# Patient Record
Sex: Male | Born: 1970 | Hispanic: Yes | Marital: Married | Smoking: Former smoker
Health system: Southern US, Community
[De-identification: ages and names within clinical notes are randomized; demographics above are authoritative.]

## PROBLEM LIST (undated history)

## (undated) DIAGNOSIS — E785 Hyperlipidemia, unspecified: Secondary | ICD-10-CM

## (undated) DIAGNOSIS — K219 Gastro-esophageal reflux disease without esophagitis: Secondary | ICD-10-CM

## (undated) HISTORY — PX: WISDOM TOOTH EXTRACTION: SHX21

## (undated) HISTORY — DX: Gastro-esophageal reflux disease without esophagitis: K21.9

## (undated) HISTORY — DX: Hyperlipidemia, unspecified: E78.5

## (undated) HISTORY — PX: DENTAL SURGERY: SHX609

## (undated) HISTORY — PX: ROOT CANAL: SHX2363

---

## 2013-01-15 DIAGNOSIS — N411 Chronic prostatitis: Secondary | ICD-10-CM | POA: Insufficient documentation

## 2013-01-15 DIAGNOSIS — F419 Anxiety disorder, unspecified: Secondary | ICD-10-CM | POA: Insufficient documentation

## 2013-01-15 DIAGNOSIS — M549 Dorsalgia, unspecified: Secondary | ICD-10-CM | POA: Insufficient documentation

## 2013-01-15 DIAGNOSIS — IMO0001 Reserved for inherently not codable concepts without codable children: Secondary | ICD-10-CM | POA: Insufficient documentation

## 2013-01-15 DIAGNOSIS — K219 Gastro-esophageal reflux disease without esophagitis: Secondary | ICD-10-CM | POA: Insufficient documentation

## 2013-01-23 DIAGNOSIS — E785 Hyperlipidemia, unspecified: Secondary | ICD-10-CM | POA: Insufficient documentation

## 2013-02-10 DIAGNOSIS — R7401 Elevation of levels of liver transaminase levels: Secondary | ICD-10-CM | POA: Insufficient documentation

## 2014-02-18 ENCOUNTER — Emergency Department: Payer: Self-pay | Admitting: Emergency Medicine

## 2015-05-03 ENCOUNTER — Ambulatory Visit
Admission: RE | Admit: 2015-05-03 | Discharge: 2015-05-03 | Disposition: A | Discharge status: Home / Self Care | Payer: BLUE CROSS/BLUE SHIELD | Source: Ambulatory Visit | Attending: Physical Medicine and Rehabilitation | Admitting: Physical Medicine and Rehabilitation

## 2015-05-03 ENCOUNTER — Other Ambulatory Visit: Payer: Self-pay | Admitting: *Deleted

## 2015-05-03 DIAGNOSIS — M542 Cervicalgia: Secondary | ICD-10-CM | POA: Diagnosis present

## 2016-09-25 DIAGNOSIS — R0609 Other forms of dyspnea: Secondary | ICD-10-CM | POA: Insufficient documentation

## 2016-09-25 DIAGNOSIS — R5382 Chronic fatigue, unspecified: Secondary | ICD-10-CM | POA: Insufficient documentation

## 2016-09-25 DIAGNOSIS — R079 Chest pain, unspecified: Secondary | ICD-10-CM | POA: Insufficient documentation

## 2020-03-07 ENCOUNTER — Ambulatory Visit: Payer: BLUE CROSS/BLUE SHIELD

## 2020-03-13 ENCOUNTER — Ambulatory Visit: Payer: BLUE CROSS/BLUE SHIELD | Attending: Internal Medicine

## 2020-03-13 DIAGNOSIS — Z23 Encounter for immunization: Secondary | ICD-10-CM

## 2020-03-13 NOTE — Progress Notes (Signed)
   Covid-19 Vaccination Clinic  Name:  Filippo Puls    MRN: [id number] DOB: November 13, 1971  03/13/2020  Mr. Woelfel was observed post Covid-19 immunization for 15 minutes without incident. He was provided with Vaccine Information Sheet and instruction to access the V-Safe system.   Mr. Sherrow was instructed to call 911 with any severe reactions post vaccine: Marland Kitchen Difficulty breathing  . Swelling of face and throat  . A fast heartbeat  . A bad rash all over body  . Dizziness and weakness   Immunizations Administered    Name Date Dose VIS Date Route   Pfizer COVID-19 Vaccine 03/13/2020 11:34 AM 0.3 mL 11/27/2019 Intramuscular   Manufacturer: [employer], [employer]   Lot: [id number]   NDC: [address]-2

## 2020-04-03 ENCOUNTER — Ambulatory Visit: Payer: BLUE CROSS/BLUE SHIELD

## 2021-07-03 LAB — BASIC METABOLIC PANEL
BUN: 14 (ref 4–21)
CO2: 26 — AB (ref 13–22)
Chloride: 101 (ref 99–108)
Creatinine: 0.9 (ref 0.6–1.3)
Glucose: 97
Potassium: 4.7 (ref 3.4–5.3)
Sodium: 138 (ref 137–147)

## 2021-07-03 LAB — CBC AND DIFFERENTIAL
HCT: 45 (ref 41–53)
Hemoglobin: 15.4 (ref 13.5–17.5)
Neutrophils Absolute: 3.6
Platelets: 270 (ref 150–399)
WBC: 5.4

## 2021-07-03 LAB — HEPATIC FUNCTION PANEL
ALT: 19 (ref 10–40)
AST: 16 (ref 14–40)
Alkaline Phosphatase: 93 (ref 25–125)
Bilirubin, Total: 0.3

## 2021-07-03 LAB — OB RESULTS CONSOLE GC/CHLAMYDIA: Chlamydia: NEGATIVE

## 2021-07-03 LAB — COMPREHENSIVE METABOLIC PANEL
Albumin: 4.6 (ref 3.5–5.0)
Calcium: 9.7 (ref 8.7–10.7)
GFR calc non Af Amer: 101
Globulin: 2.4

## 2021-07-03 LAB — CBC: RBC: 5.01 (ref 3.87–5.11)

## 2021-07-03 LAB — TSH: TSH: 1.57 (ref 0.41–5.90)

## 2021-07-03 LAB — TESTOSTERONE: Testosterone: 579

## 2021-11-29 ENCOUNTER — Other Ambulatory Visit: Payer: Self-pay

## 2021-11-29 ENCOUNTER — Encounter: Payer: Self-pay | Admitting: Family Medicine

## 2021-11-29 ENCOUNTER — Ambulatory Visit (INDEPENDENT_AMBULATORY_CARE_PROVIDER_SITE_OTHER): Payer: Self-pay | Admitting: Family Medicine

## 2021-11-29 VITALS — BP 124/82 | HR 60 | Ht 69.0 in | Wt 165.0 lb

## 2021-11-29 DIAGNOSIS — R5382 Chronic fatigue, unspecified: Secondary | ICD-10-CM

## 2021-11-29 DIAGNOSIS — Z13220 Encounter for screening for lipoid disorders: Secondary | ICD-10-CM

## 2021-11-29 DIAGNOSIS — R6882 Decreased libido: Secondary | ICD-10-CM | POA: Insufficient documentation

## 2021-11-29 DIAGNOSIS — F419 Anxiety disorder, unspecified: Secondary | ICD-10-CM | POA: Insufficient documentation

## 2021-11-29 DIAGNOSIS — F418 Other specified anxiety disorders: Secondary | ICD-10-CM

## 2021-11-29 DIAGNOSIS — E559 Vitamin D deficiency, unspecified: Secondary | ICD-10-CM

## 2021-11-29 NOTE — Assessment & Plan Note (Signed)
New onset in the setting of chronic fatigue and depression/anxiety concern.  Risk stratification labs ordered, will follow accordingly.

## 2021-11-29 NOTE — Patient Instructions (Signed)
-   Obtain fasting labs - Review information provided - Return in 4 weeks - Contact for questions

## 2021-11-29 NOTE — Assessment & Plan Note (Signed)
See additional assessment(s) for plan details. 

## 2021-11-29 NOTE — Progress Notes (Signed)
°  ° °  Primary Care / Sports Medicine Office Visit  Patient Information:  Patient ID: Romel Dumond, male DOB: [birth year] Age: 50 y.o. MRN: [id number]   Keiland Pickering is a pleasant 50 y.o. male presenting with the following:  Chief Complaint  Patient presents with   New Patient (Initial Visit)   Establish Care   Fatigue    X6 months; difficulty concentrating, lack of motivation    Patient Active Problem List   Diagnosis Date Noted   Low libido 11/29/2021   Depression with anxiety 11/29/2021   Chronic fatigue 09/25/2016   DOE (dyspnea on exertion) 09/25/2016   Exertional chest pain 09/25/2016   Elevated alanine aminotransferase (ALT) level 02/10/2013   Hyperlipidemia 01/23/2013   Anxiety 01/15/2013   Back pain 01/15/2013   Elevated BP 01/15/2013   GERD (gastroesophageal reflux disease) 01/15/2013   Prostatitis, chronic 01/15/2013    Vitals:   11/29/21 1356  BP: 124/82  Pulse: 60  SpO2: 97%   Vitals:   11/29/21 1356  Weight: 165 lb (74.8 kg)  Height: 5\' 9"  (1.753 m)   Body mass index is 24.37 kg/m.  No results found.   Independent interpretation of notes and tests performed by another provider:   None  Procedures performed:   None  Pertinent History, Exam, Impression, and Recommendations:   Chronic fatigue Chronic issue with progressive worsening over the past 6 months, this did coincide with recent life stressors in regards to his relationship, additionally brings up history of concern over anxiety/depression, concentration issues, mental fog.  His cardiopulmonary findings are benign, plan for risk stratification labs, nonpharmacologic management of stress, and close follow-up.  If lab work-up reassuring, further depression anxiety management to be considered as well as sleep evaluation.  Low libido New onset in the setting of chronic fatigue and depression/anxiety concern.  Risk stratification labs ordered, will follow accordingly.  Depression with  anxiety See additional assessment(s) for plan details.   Orders & Medications No orders of the defined types were placed in this encounter.  Orders Placed This Encounter  Procedures   TSH Rfx on Abnormal to Free T4   CBC with Differential/Platelet   Comprehensive metabolic panel   Lipid panel   Reticulocytes   B12 and Folate Panel   VITAMIN D 25 Hydroxy (Vit-D Deficiency, Fractures)     Return in about 4 weeks (around 12/27/2021).     02/24/2022, MD   Primary Care Sports Medicine [facility]

## 2021-11-29 NOTE — Assessment & Plan Note (Signed)
Chronic issue with progressive worsening over the past 6 months, this did coincide with recent life stressors in regards to his relationship, additionally brings up history of concern over anxiety/depression, concentration issues, mental fog.  His cardiopulmonary findings are benign, plan for risk stratification labs, nonpharmacologic management of stress, and close follow-up.  If lab work-up reassuring, further depression anxiety management to be considered as well as sleep evaluation.

## 2021-12-01 LAB — COMPREHENSIVE METABOLIC PANEL
ALT: 35 IU/L (ref 0–44)
AST: 20 IU/L (ref 0–40)
Albumin/Globulin Ratio: 2.3 — ABNORMAL HIGH (ref 1.2–2.2)
Albumin: 5.2 g/dL — ABNORMAL HIGH (ref 4.0–5.0)
Alkaline Phosphatase: 97 IU/L (ref 44–121)
BUN/Creatinine Ratio: 15 (ref 9–20)
BUN: 14 mg/dL (ref 6–24)
Bilirubin Total: 0.4 mg/dL (ref 0.0–1.2)
CO2: 29 mmol/L (ref 20–29)
Calcium: 9.9 mg/dL (ref 8.7–10.2)
Chloride: 97 mmol/L (ref 96–106)
Creatinine, Ser: 0.96 mg/dL (ref 0.76–1.27)
Globulin, Total: 2.3 g/dL (ref 1.5–4.5)
Glucose: 87 mg/dL (ref 70–99)
Potassium: 4.6 mmol/L (ref 3.5–5.2)
Sodium: 139 mmol/L (ref 134–144)
Total Protein: 7.5 g/dL (ref 6.0–8.5)
eGFR: 96 mL/min/{1.73_m2} (ref >59)

## 2021-12-01 LAB — LIPID PANEL
Chol/HDL Ratio: 5.7 ratio — ABNORMAL HIGH (ref 0.0–5.0)
Cholesterol, Total: 261 mg/dL — ABNORMAL HIGH (ref 100–199)
HDL: 46 mg/dL (ref >39)
LDL Chol Calc (NIH): 189 mg/dL — ABNORMAL HIGH (ref 0–99)
Triglycerides: 144 mg/dL (ref 0–149)
VLDL Cholesterol Cal: 26 mg/dL (ref 5–40)

## 2021-12-01 LAB — CBC WITH DIFFERENTIAL/PLATELET
Basophils Absolute: 0 10*3/uL (ref 0.0–0.2)
Basos: 1 %
EOS (ABSOLUTE): 0.1 10*3/uL (ref 0.0–0.4)
Eos: 1 %
Hematocrit: 48.8 % (ref 37.5–51.0)
Hemoglobin: 16.6 g/dL (ref 13.0–17.7)
Immature Grans (Abs): 0 10*3/uL (ref 0.0–0.1)
Immature Granulocytes: 1 %
Lymphocytes Absolute: 1.7 10*3/uL (ref 0.7–3.1)
Lymphs: 27 %
MCH: 30.5 pg (ref 26.6–33.0)
MCHC: 34 g/dL (ref 31.5–35.7)
MCV: 90 fL (ref 79–97)
Monocytes Absolute: 0.4 10*3/uL (ref 0.1–0.9)
Monocytes: 6 %
Neutrophils Absolute: 4.1 10*3/uL (ref 1.4–7.0)
Neutrophils: 64 %
Platelets: 247 10*3/uL (ref 150–450)
RBC: 5.44 x10E6/uL (ref 4.14–5.80)
RDW: 12.7 % (ref 11.6–15.4)
WBC: 6.3 10*3/uL (ref 3.4–10.8)

## 2021-12-01 LAB — VITAMIN D 25 HYDROXY (VIT D DEFICIENCY, FRACTURES): Vit D, 25-Hydroxy: 23 ng/mL — ABNORMAL LOW (ref 30.0–100.0)

## 2021-12-01 LAB — RETICULOCYTES: Retic Ct Pct: 1.7 % (ref 0.6–2.6)

## 2021-12-01 LAB — B12 AND FOLATE PANEL
Folate: 10.3 ng/mL (ref >3.0)
Vitamin B-12: 1860 pg/mL — ABNORMAL HIGH (ref 232–1245)

## 2021-12-01 LAB — TSH RFX ON ABNORMAL TO FREE T4: TSH: 1.88 u[IU]/mL (ref 0.450–4.500)

## 2021-12-04 ENCOUNTER — Other Ambulatory Visit: Payer: Self-pay | Admitting: Family Medicine

## 2021-12-04 DIAGNOSIS — E559 Vitamin D deficiency, unspecified: Secondary | ICD-10-CM

## 2021-12-04 DIAGNOSIS — R748 Abnormal levels of other serum enzymes: Secondary | ICD-10-CM

## 2021-12-04 DIAGNOSIS — E782 Mixed hyperlipidemia: Secondary | ICD-10-CM

## 2021-12-04 MED ORDER — VITAMIN D (ERGOCALCIFEROL) 1.25 MG (50000 UNIT) PO CAPS: 50000.0000 [IU] | ORAL_CAPSULE | ORAL | 0 refills | Status: DC

## 2021-12-04 MED ORDER — ROSUVASTATIN CALCIUM 5 MG PO TABS: 5.0000 mg | ORAL_TABLET | Freq: Every day | ORAL | 3 refills | Status: DC

## 2021-12-12 ENCOUNTER — Encounter: Payer: Self-pay | Admitting: Family Medicine

## 2021-12-12 ENCOUNTER — Ambulatory Visit
Admission: RE | Admit: 2021-12-12 | Discharge: 2021-12-12 | Disposition: A | Discharge status: Home / Self Care | Payer: Self-pay | Source: Ambulatory Visit | Attending: Family Medicine | Admitting: Family Medicine

## 2021-12-12 ENCOUNTER — Other Ambulatory Visit: Payer: Self-pay

## 2021-12-12 DIAGNOSIS — E782 Mixed hyperlipidemia: Secondary | ICD-10-CM | POA: Insufficient documentation

## 2021-12-12 DIAGNOSIS — R748 Abnormal levels of other serum enzymes: Secondary | ICD-10-CM | POA: Insufficient documentation

## 2021-12-12 NOTE — Progress Notes (Signed)
Please let Mr. Duval know that the ultrasound liver findings were most consistent with fatty liver, this fatty change can account for the lab results noted. For now continue healthy lifestyle changes and I will see him 12/27/2021

## 2021-12-27 ENCOUNTER — Other Ambulatory Visit: Payer: Self-pay

## 2021-12-27 ENCOUNTER — Ambulatory Visit (INDEPENDENT_AMBULATORY_CARE_PROVIDER_SITE_OTHER): Payer: Self-pay | Admitting: Family Medicine

## 2021-12-27 ENCOUNTER — Encounter: Payer: Self-pay | Admitting: Family Medicine

## 2021-12-27 VITALS — BP 142/90 | HR 86 | Ht 69.0 in | Wt 166.0 lb

## 2021-12-27 DIAGNOSIS — R5382 Chronic fatigue, unspecified: Secondary | ICD-10-CM

## 2021-12-27 DIAGNOSIS — R143 Flatulence: Secondary | ICD-10-CM | POA: Insufficient documentation

## 2021-12-27 DIAGNOSIS — F418 Other specified anxiety disorders: Secondary | ICD-10-CM

## 2021-12-27 HISTORY — DX: Flatulence: R14.3

## 2021-12-27 MED ORDER — DESVENLAFAXINE SUCCINATE ER 25 MG PO TB24: 25.0000 mg | ORAL_TABLET | Freq: Every day | ORAL | 1 refills | Status: DC

## 2021-12-27 NOTE — Assessment & Plan Note (Signed)
Newly brought up issue that he has been experiencing for several weeks, notes this more with food intake.  No change in odor, no steatorrhea or change in his bowels.  He has noted bloating and increased flatulence.  I reviewed various possible causes of this and treatments.  He is amenable to a trial of probiotics that he can pick up OTC.  We will follow-up on this on as-needed basis.

## 2021-12-27 NOTE — Progress Notes (Signed)
Primary Care / Sports Medicine Office Visit  Patient Information:  Patient ID: Adrian Lee, male DOB: 09/16/1971 Age: 51 y.o. MRN: [id number]   Adrian Lee is a pleasant 51 y.o. male presenting with the following:  Chief Complaint  Patient presents with   Chronic Fatigue     Pt feels tired on and off, has gotten better, pt still wakes up at 3:30 every morning, cant go back to sleep due to mind racing, Only sleeps 5 hours at night    Depression w/ Anxiety     Patient Active Problem List   Diagnosis Date Noted   Flatulence 12/27/2021   Low libido 11/29/2021   Depression with anxiety 11/29/2021   Chronic fatigue 09/25/2016   DOE (dyspnea on exertion) 09/25/2016   Exertional chest pain 09/25/2016   Elevated alanine aminotransferase (ALT) level 02/10/2013   Hyperlipidemia 01/23/2013   Anxiety 01/15/2013   Back pain 01/15/2013   Elevated BP 01/15/2013   GERD (gastroesophageal reflux disease) 01/15/2013   Prostatitis, chronic 01/15/2013    Vitals:   12/27/21 1005  BP: (!) 142/90  Pulse: 86  SpO2: 97%   Vitals:   12/27/21 1005  Weight: 166 lb (75.3 kg)  Height: 5\' 9"  (1.753 m)   Body mass index is 24.51 kg/m.  ELASTOGRAPHY LIVER  Result Date: 12/12/2021 CLINICAL DATA:  Abnormal liver function tests EXAM: 12/14/2021 LIVER ELASTOGRAPHY TECHNIQUE: Sonography of the liver was performed. In addition, ultrasound elastography evaluation of the liver was performed. A region of interest was placed within the right lobe of the liver. Following application of a compressive sonographic pulse, tissue compressibility was assessed. Multiple assessments were performed at the selected site. Median tissue compressibility was determined. Previously, hepatic stiffness was assessed by shear wave velocity. Based on recently published Society of Radiologists in Ultrasound consensus article, reporting is now recommended to be performed in the SI units of pressure (kiloPascals) representing hepatic  stiffness/elasticity. The obtained result is compared to the published reference standards. (cACLD = compensated Advanced Chronic Liver Disease) COMPARISON:  None. FINDINGS: Liver: There is increased echogenicity suggesting fatty infiltration. No focal abnormality is seen. There is no definite nodularity in the liver surface. Portal vein is patent on color Doppler imaging with normal direction of blood flow towards the liver. ULTRASOUND HEPATIC ELASTOGRAPHY Device: Siemens Helix VTQ Patient position: Supine Transducer: 5 CL Number of measurements: 10 Hepatic segment:  8 Median kPa: 3.8 IQR: 0.6 IQR/Median kPa ratio: 0.2 Data quality: Diagnostic category: The use of hepatic elastography is applicable to patients with viral hepatitis and non-alcoholic fatty liver disease. At this time, there is insufficient data for the referenced cut-off values and use in other causes of liver disease, including alcoholic liver disease. Patients, however, may be assessed by elastography and serve as their own reference standard/baseline. In patients with non-alcoholic liver disease, the values suggesting compensated advanced chronic liver disease (cACLD) may be lower, and patients may need additional testing with elasticity results of 7-9 kPa. Please note that abnormal hepatic elasticity and shear wave velocities may also be identified in clinical settings other than with hepatic fibrosis, such as: acute hepatitis, elevated right heart and central venous pressures including use of beta blockers, veno-occlusive disease (Budd-Chiari), infiltrative processes such as mastocytosis/amyloidosis/infiltrative tumor/lymphoma, extrahepatic cholestasis, with hyperemia in the post-prandial state, and with liver transplantation. Correlation with patient history, laboratory data, and clinical condition recommended. Diagnostic Categories: < or =5 kPa: high probability of being normal < or =9 kPa: in the absence of  other known clinical signs, rules  out cACLD >9 kPa and ?13 kPa: suggestive of cACLD, but needs further testing >13 kPa: highly suggestive of cACLD > or =17 kPa: highly suggestive of cACLD with an increased probability of clinically significant portal hypertension IMPRESSION: ULTRASOUND LIVER: There is fatty infiltration in the liver. ULTRASOUND HEPATIC ELASTOGRAPHY: Median kPa:  3.8 Diagnostic category:  High probability of being normal Electronically Signed   By: Ernie Avena M.D.   On: 12/12/2021 09:27     Independent interpretation of notes and tests performed by another provider:   None  Procedures performed:   None  Pertinent History, Exam, Impression, and Recommendations:   Depression with anxiety Patient is demonstrated slightly worsened GAD, slightly improved PHQ, still symptomatic.  Given his concern over fatigue, low libido, low energy, and sleep disturbance, recent reassuring labs that do not directly account for any of those issues, we have discussed depression/anxiety as primary etiology behind his symptoms.  Treatment strategies were outlined, questions were answered at length, and is amenable to start of Pristiq 25 mg daily.  He is to return for follow-up in 6 weeks time for reevaluation.  Pending symptoms at that time, can discuss further titration or referral to psychiatry for further evaluation and management strategies such as cognitive behavioral therapy.  Chronic issue with exacerbation, medication management.  Chronic fatigue Recent serum studies reassuring and do not show any findings to directly account for his chronic fatigue.  Concern for depression/anxiety as primary etiology. See additional assessment(s) for plan details.  We will recheck his symptoms at his return, if still symptomatic, pending residual symptoms may benefit from sleep study/ENT sleep medicine referral.  Chronic issue, exacerbation.  Flatulence Newly brought up issue that he has been experiencing for several weeks, notes this  more with food intake.  No change in odor, no steatorrhea or change in his bowels.  He has noted bloating and increased flatulence.  I reviewed various possible causes of this and treatments.  He is amenable to a trial of probiotics that he can pick up OTC.  We will follow-up on this on as-needed basis.   Orders & Medications Meds ordered this encounter  Medications   desvenlafaxine (PRISTIQ) 25 MG 24 hr tablet    Sig: Take 1 tablet (25 mg total) by mouth daily.    Dispense:  30 tablet    Refill:  1   No orders of the defined types were placed in this encounter.    Return in about 6 weeks (around 02/07/2022).     Jerrol Banana, MD   Primary Care Sports Medicine [facility]

## 2021-12-27 NOTE — Assessment & Plan Note (Addendum)
Recent serum studies reassuring and do not show any findings to directly account for his chronic fatigue.  Concern for depression/anxiety as primary etiology. See additional assessment(s) for plan details.  We will recheck his symptoms at his return, if still symptomatic, pending residual symptoms may benefit from sleep study/ENT sleep medicine referral.  Chronic issue, exacerbation.

## 2021-12-27 NOTE — Assessment & Plan Note (Signed)
Patient is demonstrated slightly worsened GAD, slightly improved PHQ, still symptomatic.  Given his concern over fatigue, low libido, low energy, and sleep disturbance, recent reassuring labs that do not directly account for any of those issues, we have discussed depression/anxiety as primary etiology behind his symptoms.  Treatment strategies were outlined, questions were answered at length, and is amenable to start of Pristiq 25 mg daily.  He is to return for follow-up in 6 weeks time for reevaluation.  Pending symptoms at that time, can discuss further titration or referral to psychiatry for further evaluation and management strategies such as cognitive behavioral therapy.  Chronic issue with exacerbation, medication management.

## 2021-12-27 NOTE — Patient Instructions (Addendum)
-   Start Pristiq daily - Can start over the counter probiotic - Return in 6 weeks

## 2022-02-07 ENCOUNTER — Other Ambulatory Visit: Payer: Self-pay

## 2022-02-07 ENCOUNTER — Ambulatory Visit (INDEPENDENT_AMBULATORY_CARE_PROVIDER_SITE_OTHER): Payer: Self-pay | Admitting: Family Medicine

## 2022-02-07 ENCOUNTER — Encounter: Payer: Self-pay | Admitting: Family Medicine

## 2022-02-07 ENCOUNTER — Telehealth: Payer: Self-pay

## 2022-02-07 VITALS — BP 124/72 | HR 86 | Ht 69.0 in | Wt 165.0 lb

## 2022-02-07 DIAGNOSIS — R6882 Decreased libido: Secondary | ICD-10-CM

## 2022-02-07 DIAGNOSIS — Z125 Encounter for screening for malignant neoplasm of prostate: Secondary | ICD-10-CM | POA: Insufficient documentation

## 2022-02-07 DIAGNOSIS — R5382 Chronic fatigue, unspecified: Secondary | ICD-10-CM

## 2022-02-07 DIAGNOSIS — F418 Other specified anxiety disorders: Secondary | ICD-10-CM

## 2022-02-07 DIAGNOSIS — N411 Chronic prostatitis: Secondary | ICD-10-CM

## 2022-02-07 DIAGNOSIS — Z1211 Encounter for screening for malignant neoplasm of colon: Secondary | ICD-10-CM | POA: Insufficient documentation

## 2022-02-07 MED ORDER — DESVENLAFAXINE SUCCINATE ER 50 MG PO TB24: 50.0000 mg | ORAL_TABLET | Freq: Every day | ORAL | 2 refills | Status: DC

## 2022-02-07 NOTE — Assessment & Plan Note (Signed)
Patient presents for follow-up to depression/anxiety, at his last visit on 12/27/2021 he had progressively worsened GAD and slightly improved PHQ that was still symptomatic.  Given his spectrum of symptoms, he was amenable to initiation of Pristiq 25 mg daily.  He has tolerated this well and has noted significant improvement in his stated symptoms though without full resolution.  He denies any adverse effects during the initiation of this medication or since.  Given his stated symptomatology, level of improvement, we discussed medication management options and he was amenable to further titration to Pristiq 50 mg daily.  We will coordinate a virtual visit in 3 months to assess his response to new dose.  He was advised to contact us sooner if further adjustments needed such as a decrease to previous dose.  Chronic condition, stable, Rx management

## 2022-02-07 NOTE — Progress Notes (Signed)
°  ° °  Primary Care / Sports Medicine Office Visit  Patient Information:  Patient ID: Adrian Lee, male DOB: [birth year] Age: 51 y.o. MRN: [id number]   Adrian Lee is a pleasant 51 y.o. male presenting with the following:  Chief Complaint  Patient presents with   Depression with anxiety    Tolerating Pristiq 25 mg well    Vitals:   02/07/22 0956  BP: 124/72  Pulse: 86  SpO2: 97%   Vitals:   02/07/22 0956  Weight: 165 lb (74.8 kg)  Height: 5\' 9"  (1.753 m)   Body mass index is 24.37 kg/m.  No results found.   Independent interpretation of notes and tests performed by another provider:   None  Procedures performed:   None  Pertinent History, Exam, Impression, and Recommendations:   Prostatitis, chronic Patient with a history of chronic prostatitis, raises desire for prostate cancer screening, guidelines reviewed, and given his history of chronic prostatitis, desire for further evaluation, I have advised a referral to urology for further evaluation and management.  Prostate cancer screening See additional assessment(s) for plan details.  Low libido Has noted persistent low libido despite improvement in his depression/anxiety, we discussed pharmacotherapy with sildenafil, etc., he raises concern over testosterone.  Given his comorbid prostate history, desire for further specialist evaluation, I have placed a referral to urology.  Chronic fatigue Patient presents for follow-up to chronic fatigue, at his last visit on 12/27/2021 he was noted to have reassuring lab studies, treatment initiated for comorbid depression/anxiety to assess the role of this as underlying etiology behind his chronic fatigue.  He has noted improvement and near resolution of his chronic fatigue following initial dose of Pristiq.  Further titration of Pristiq reviewed and we will follow-up on this issue in 3 months time.  Chronic condition, stable, Rx management  Depression with anxiety Patient  presents for follow-up to depression/anxiety, at his last visit on 12/27/2021 he had progressively worsened GAD and slightly improved PHQ that was still symptomatic.  Given his spectrum of symptoms, he was amenable to initiation of Pristiq 25 mg daily.  He has tolerated this well and has noted significant improvement in his stated symptoms though without full resolution.  He denies any adverse effects during the initiation of this medication or since.  Given his stated symptomatology, level of improvement, we discussed medication management options and he was amenable to further titration to Pristiq 50 mg daily.  We will coordinate a virtual visit in 3 months to assess his response to new dose.  He was advised to contact 02/24/2022 sooner if further adjustments needed such as a decrease to previous dose.  Chronic condition, stable, Rx management  Screening for colon cancer Referral placed for colon cancer screening/colonoscopy.   Orders & Medications Meds ordered this encounter  Medications   desvenlafaxine (PRISTIQ) 50 MG 24 hr tablet    Sig: Take 1 tablet (50 mg total) by mouth daily.    Dispense:  30 tablet    Refill:  2   Orders Placed This Encounter  Procedures   Ambulatory referral to Gastroenterology   Ambulatory referral to Urology     Return in about 3 months (around 05/07/2022) for Virtual visit.     05/09/2022, MD   Primary Care Sports Medicine [facility]

## 2022-02-07 NOTE — Patient Instructions (Signed)
-   Take new dose of Pristiq 50 mg daily - Referral coordinator will contact you in regards to scheduling a follow-up visit with both urology and gastroenterology - Contact us for any questions - Virtual follow-up in 3 months

## 2022-02-07 NOTE — Assessment & Plan Note (Signed)
Has noted persistent low libido despite improvement in his depression/anxiety, we discussed pharmacotherapy with sildenafil, etc., he raises concern over testosterone.  Given his comorbid prostate history, desire for further specialist evaluation, I have placed a referral to urology.

## 2022-02-07 NOTE — Assessment & Plan Note (Signed)
Patient presents for follow-up to chronic fatigue, at his last visit on 12/27/2021 he was noted to have reassuring lab studies, treatment initiated for comorbid depression/anxiety to assess the role of this as underlying etiology behind his chronic fatigue.  He has noted improvement and near resolution of his chronic fatigue following initial dose of Pristiq.  Further titration of Pristiq reviewed and we will follow-up on this issue in 3 months time.  Chronic condition, stable, Rx management

## 2022-02-07 NOTE — Assessment & Plan Note (Signed)
Patient with a history of chronic prostatitis, raises desire for prostate cancer screening, guidelines reviewed, and given his history of chronic prostatitis, desire for further evaluation, I have advised a referral to urology for further evaluation and management.

## 2022-02-07 NOTE — Assessment & Plan Note (Signed)
See additional assessment(s) for plan details. 

## 2022-02-07 NOTE — Telephone Encounter (Signed)
CALLED PATIENT NO ANSWER LEFT VOICEMAIL FOR A CALL BACK ? ?

## 2022-02-07 NOTE — Assessment & Plan Note (Signed)
Referral placed for colon cancer screening/colonoscopy.

## 2022-02-08 ENCOUNTER — Telehealth: Payer: Self-pay

## 2022-02-08 NOTE — Telephone Encounter (Signed)
CALLED PATIENT NO ANSWER LEFT VOICEMAIL FOR A CALL BACK °Letter sent °

## 2022-02-14 NOTE — Progress Notes (Signed)
? ?02/16/22 ?9:11 AM  ? ?Adrian Lee ?1971-05-02 ?[id number] ? ?Referring provider:  ?Jerrol Banana, MD ?[address] ?[address] ?[place],  [place] [address] ?Chief Complaint  ?Patient presents with  ? New Patient (Initial Visit)  ? Prostatitis  ? ? ? ?HPI: ?Adrian Lee is a 51 y.o.male who presents today for further evaluation of chronic prostatitis.  ? ?He has a history of chronic prostatitis and decreased libido. He was seen by his PCP, Dr. Lauris Serviss Royalty on 02/07/2022 and desired prostate cancer screening guidelines were discussed and he was further referred to urology for discussion of prostate cancer screening and prostatitis.  ? ?He reports that he is interested in prostate cancer screening. ? ?He is concerned for low testosterone he has a low libido and tiredness. He has been taking over the counter pills named Rhinos.  Of note, he had a normal testosterone level in 06/2021.  He reports that he is seeing other men at the gym who started taking testosterone and have improved erections, are able to build more muscle, just generally feel improved. ? ?He last had prostatitis 2 years ago after his vasectomy and the problems went away. He has burning and with urination but this is fairly rare.  Previously when he had episodes of prostatitis, he reports pelvic and perineal discomfort.  He is not experiencing this currently. ? ?He reports that he took a friends viagra but he was not able to have an erection until the morning that was prolonged.  He feels like the medication did not help him specifically that time.  He feels a decreased ability to achieve and maintain erections when desired. ? ?He has no family history of prostate cancer. ? ?PMH: ?Past Medical History:  ?Diagnosis Date  ? Flatulence 12/27/2021  ? GERD (gastroesophageal reflux disease)   ? Hyperlipidemia   ? ? ?Surgical History: ?Past Surgical History:  ?Procedure Laterality Date  ? ROOT CANAL    ? WISDOM TOOTH EXTRACTION Bilateral   ? ? ?Home Medications:   ?Allergies as of 02/15/2022   ?No Known Allergies ?  ? ?  ?Medication List  ?  ? ?  ? Accurate as of February 15, 2022 11:59 PM. If you have any questions, ask your nurse or doctor.  ?  ?  ? ?  ? ?desvenlafaxine 50 MG 24 hr tablet ?Commonly known as: PRISTIQ ?Take 1 tablet (50 mg total) by mouth daily. ?  ?MULTIVITAMIN ADULT PO ?Take 1 tablet by mouth daily. ?  ?OMEPRAZOLE PO ?Take 1 capsule by mouth as needed. ?  ?rosuvastatin 5 MG tablet ?Commonly known as: Crestor ?Take 1 tablet (5 mg total) by mouth daily. ?  ?sildenafil 20 MG tablet ?Commonly known as: REVATIO ?1-5 tablets as needed one hour prior to intercourse. ?Started by: Vanna Scotland, MD ?  ? ?  ? ? ?Allergies: No Known Allergies ? ?Family History: ?Family History  ?Problem Relation Age of Onset  ? Diabetes Mother   ? ? ?Social History:  reports that he has quit smoking. His smoking use included cigarettes. He has never been exposed to tobacco smoke. He has never used smokeless tobacco. He reports current alcohol use. He reports that he does not currently use drugs after having used the following drugs: Marijuana, Cocaine, and Oxycodone. ? ? ?Physical Exam: ?BP 126/85   Pulse 77   Ht 5\' 9"  (1.753 m)   Wt 165 lb (74.8 kg)   BMI 24.37 kg/m?   ?Constitutional:  Alert and oriented, No acute  distress. ?HEENT: [place] AT, moist mucus membranes.  Trachea midline, no masses. ?Cardiovascular: No clubbing, cyanosis, or edema. ?Respiratory: Normal respiratory effort, no increased work of breathing. ?Rectal: Normal sphincter tone,  20  CC prostate, smooth no nodules ?Skin: No rashes, bruises or suspicious lesions. ?Neurologic: Grossly intact, no focal deficits, moving all 4 extremities. ?Psychiatric: Normal mood and affect. ? ?Laboratory Data: ?Lab Results  ?Component Value Date  ? CREATININE 0.96 11/30/2021  ? ?Lab Results  ?Component Value Date  ? TESTOSTERONE 579 07/03/2021  ? ?Assessment & Plan:   ?Prostate cancer screening  ?- Rectal exam today  ?- PSA; pending; will  call him with the results  ?- If his PSA is within normal range his next screening will be at age 77. ? ?2. Chronic Prostatitis  ?- Urinalysis unremarkable today  ?- He has not had any symptoms ? ?3. Low libido/fatigue ?- He is concerned for low testosterone today and would like his testosterone levels checked  ?- Testosterone level; pending  ?-We did have a discussion that if his testosterone levels are normal, we can treat his erectile dysfunction and look for other reasons for his libido and fatigue.  We will not be able to administer testosterone with normal testosterone levels. ? ?4. ED ?- He is interested in medication today  ?-We discussed optimal administration today including at least 1 to 2 hours before sexual activity ideally on empty stomach. ?- Viagra; prescribed  ? ? ? ?Tawni Millers as a scribe for Vanna Scotland, MD.,have documented all relevant documentation on the behalf of Vanna Scotland, MD,as directed by  Vanna Scotland, MD while in the presence of Vanna Scotland, MD. ? ?I have reviewed the above documentation for accuracy and completeness, and I agree with the above.  ? ?Vanna Scotland, MD ? ? ?[facility] ?[address], [address] ?[place], [place] [address] ?[phone] ?

## 2022-02-15 ENCOUNTER — Encounter: Payer: Self-pay | Admitting: Urology

## 2022-02-15 ENCOUNTER — Ambulatory Visit (INDEPENDENT_AMBULATORY_CARE_PROVIDER_SITE_OTHER): Payer: PRIVATE HEALTH INSURANCE | Admitting: Urology

## 2022-02-15 ENCOUNTER — Other Ambulatory Visit: Payer: Self-pay

## 2022-02-15 VITALS — BP 126/85 | HR 77 | Ht 69.0 in | Wt 165.0 lb

## 2022-02-15 DIAGNOSIS — N5203 Combined arterial insufficiency and corporo-venous occlusive erectile dysfunction: Secondary | ICD-10-CM

## 2022-02-15 DIAGNOSIS — Z125 Encounter for screening for malignant neoplasm of prostate: Secondary | ICD-10-CM | POA: Diagnosis not present

## 2022-02-15 DIAGNOSIS — R6882 Decreased libido: Secondary | ICD-10-CM

## 2022-02-15 DIAGNOSIS — Z87438 Personal history of other diseases of male genital organs: Secondary | ICD-10-CM

## 2022-02-15 DIAGNOSIS — N410 Acute prostatitis: Secondary | ICD-10-CM | POA: Diagnosis not present

## 2022-02-15 LAB — MICROSCOPIC EXAMINATION
Bacteria, UA: NONE SEEN
Epithelial Cells (non renal): NONE SEEN /hpf (ref 0–10)
RBC, Urine: NONE SEEN /hpf (ref 0–2)
WBC, UA: NONE SEEN /hpf (ref 0–5)

## 2022-02-15 LAB — URINALYSIS, COMPLETE
Bilirubin, UA: NEGATIVE
Glucose, UA: NEGATIVE
Ketones, UA: NEGATIVE
Leukocytes,UA: NEGATIVE
Nitrite, UA: NEGATIVE
Protein,UA: NEGATIVE
RBC, UA: NEGATIVE
Specific Gravity, UA: 1.02 (ref 1.005–1.030)
Urobilinogen, Ur: 0.2 mg/dL (ref 0.2–1.0)
pH, UA: 6.5 (ref 5.0–7.5)

## 2022-02-15 MED ORDER — SILDENAFIL CITRATE 20 MG PO TABS: ORAL_TABLET | ORAL | 6 refills | Status: DC

## 2022-02-16 ENCOUNTER — Telehealth: Payer: Self-pay | Admitting: *Deleted

## 2022-02-16 LAB — PSA: Prostate Specific Ag, Serum: 0.3 ng/mL (ref 0.0–4.0)

## 2022-02-16 LAB — TESTOSTERONE: Testosterone: 461 ng/dL (ref 264–916)

## 2022-02-16 NOTE — Telephone Encounter (Addendum)
Patient informed, voiced understanding.  Aware to have PSA screening at 84.  ? ? ? ? ?----- Message from Vanna Scotland, MD sent at 02/16/2022  9:52 AM EST ----- ?Testosterone is completely normal.  PSA is nice and low.  This is all great news.  Follow-up as needed. ? ?Vanna Scotland, MD ? ?

## 2022-03-28 ENCOUNTER — Other Ambulatory Visit: Payer: Self-pay | Admitting: Family Medicine

## 2022-03-28 DIAGNOSIS — F418 Other specified anxiety disorders: Secondary | ICD-10-CM

## 2022-03-28 NOTE — Telephone Encounter (Signed)
Requested Prescriptions  ?Pending Prescriptions Disp Refills  ?? desvenlafaxine (PRISTIQ) 25 MG 24 hr tablet [Pharmacy Med Name: DESVENLAFAXINE SUCCNT ER 25 MG] 30 tablet 1  ?  Sig: TAKE 1 TABLET (25 MG TOTAL) BY MOUTH DAILY.  ?  ? Psychiatry: Antidepressants - SNRI - desvenlafaxine & venlafaxine Failed - 03/28/2022  2:09 AM  ?  ?  Failed - Lipid Panel in normal range within the last 12 months  ?  Cholesterol, Total  ?Date Value Ref Range Status  ?11/30/2021 261 (H) 100 - 199 mg/dL Final  ? ?LDL Chol Calc (NIH)  ?Date Value Ref Range Status  ?11/30/2021 189 (H) 0 - 99 mg/dL Final  ? ?HDL  ?Date Value Ref Range Status  ?11/30/2021 46 >39 mg/dL Final  ? ?Triglycerides  ?Date Value Ref Range Status  ?11/30/2021 144 0 - 149 mg/dL Final  ? ?  ?  ?  Passed - Cr in normal range and within 360 days  ?  Creatinine, Ser  ?Date Value Ref Range Status  ?11/30/2021 0.96 0.76 - 1.27 mg/dL Final  ?   ?  ?  Passed - Completed PHQ-2 or PHQ-9 in the last 360 days  ?  ?  Passed - Last BP in normal range  ?  BP Readings from Last 1 Encounters:  ?02/15/22 126/85  ?   ?  ?  Passed - Valid encounter within last 6 months  ?  Recent Outpatient Visits   ?      ? 1 month ago Prostate cancer screening  ? [facility] Jerrol Banana, MD  ? 3 months ago Depression with anxiety  ? [facility] Jerrol Banana, MD  ? 3 months ago Chronic fatigue  ? [facility] Jerrol Banana, MD  ?  ?  ?Future Appointments   ?        ? In 1 month Ashley Royalty Ocie Bob, MD [facility]  ?  ? ?  ?  ?  ? ? ?

## 2022-05-07 ENCOUNTER — Ambulatory Visit (INDEPENDENT_AMBULATORY_CARE_PROVIDER_SITE_OTHER): Payer: Self-pay | Admitting: Family Medicine

## 2022-05-07 ENCOUNTER — Encounter: Payer: Self-pay | Admitting: Family Medicine

## 2022-05-07 DIAGNOSIS — E782 Mixed hyperlipidemia: Secondary | ICD-10-CM | POA: Diagnosis not present

## 2022-05-07 DIAGNOSIS — F418 Other specified anxiety disorders: Secondary | ICD-10-CM

## 2022-05-07 DIAGNOSIS — R6882 Decreased libido: Secondary | ICD-10-CM

## 2022-05-07 MED ORDER — DESVENLAFAXINE SUCCINATE ER 50 MG PO TB24: 50.0000 mg | ORAL_TABLET | Freq: Every day | ORAL | 3 refills | Status: DC

## 2022-05-07 NOTE — Assessment & Plan Note (Signed)
Patient had recheck of labs through outside testosterone replacement group and were find to be in normal range (reported) while on statin. He has made dietary and lifestyle changes and self-discontinued statin. Does have upcoming lab recheck and he is amenable to forwarding lipid results to our office.

## 2022-05-07 NOTE — Progress Notes (Signed)
Primary Care / Sports Medicine Virtual Visit  Patient Information:  Patient ID: Adrian Lee, male DOB: 1971/10/04 Age: 51 y.o. MRN: [id number]   Adrian Lee is a pleasant 51 y.o. male presenting with the following:  Chief Complaint  Patient presents with   Medication Refill    Pt states he isn't taking th cholesterol medication, changed his diet, will get labs through another DR and will have them faxed.     Review of Systems: No fevers, chills, night sweats, weight loss, chest pain, or shortness of breath.   Patient Active Problem List   Diagnosis Date Noted   Prostate cancer screening 02/07/2022   Screening for colon cancer 02/07/2022   Low libido 11/29/2021   Depression with anxiety 11/29/2021   Chronic fatigue 09/25/2016   DOE (dyspnea on exertion) 09/25/2016   Exertional chest pain 09/25/2016   Elevated alanine aminotransferase (ALT) level 02/10/2013   Hyperlipidemia 01/23/2013   Back pain 01/15/2013   GERD (gastroesophageal reflux disease) 01/15/2013   Prostatitis, chronic 01/15/2013   Past Medical History:  Diagnosis Date   Flatulence 12/27/2021   GERD (gastroesophageal reflux disease)    Hyperlipidemia    Outpatient Encounter Medications as of 05/07/2022  Medication Sig   Multiple Vitamin (MULTIVITAMIN ADULT PO) Take 1 tablet by mouth daily.   OMEPRAZOLE PO Take 1 capsule by mouth as needed.   sildenafil (REVATIO) 20 MG tablet 1-5 tablets as needed one hour prior to intercourse.   [DISCONTINUED] desvenlafaxine (PRISTIQ) 50 MG 24 hr tablet Take 1 tablet (50 mg total) by mouth daily.   desvenlafaxine (PRISTIQ) 50 MG 24 hr tablet Take 1 tablet (50 mg total) by mouth daily.   [DISCONTINUED] desvenlafaxine (PRISTIQ) 25 MG 24 hr tablet TAKE 1 TABLET (25 MG TOTAL) BY MOUTH DAILY.   [DISCONTINUED] rosuvastatin (CRESTOR) 5 MG tablet Take 1 tablet (5 mg total) by mouth daily. (Patient not taking: Reported on 05/07/2022)   No facility-administered encounter medications on  file as of 05/07/2022.   Past Surgical History:  Procedure Laterality Date   ROOT CANAL     WISDOM TOOTH EXTRACTION Bilateral     Virtual Visit via MyChart Video:   I connected with Aidynn Krenn on 05/07/22 via MyChart Video and verified that I am speaking with the correct person using appropriate identifiers.   The limitations, risks, security and privacy concerns of performing an evaluation and management service by MyChart, including the higher likelihood of inaccurate diagnoses and treatments, and the availability of in person appointments were reviewed. The possible need of an additional face-to-face encounter for complete and high quality delivery of care was discussed. The patient was also made aware that there may be a patient responsible charge related to this service. The patient expressed understanding and wishes to proceed.  Provider location is in medical facility. Patient location is at their home, different from provider location. People involved in care of the patient during this telehealth encounter were myself, my nurse/medical assistant, and my front office/scheduling team member.  Objective findings:   General: Speaking full sentences, no audible heavy breathing. Sounds alert and appropriately interactive.   Independent interpretation of notes and tests performed by another provider:   None  Pertinent History, Exam, Impression, and Recommendations:   Depression with anxiety Symptoms well controlled on current regimen, refilled.  Hyperlipidemia Patient had recheck of labs through outside testosterone replacement group and were find to be in normal range (reported) while on statin. He has made dietary and lifestyle changes  and self-discontinued statin. Does have upcoming lab recheck and he is amenable to forwarding lipid results to our office.   Low libido Patient did have serum testosterone level drawn at 12:03 in normal range, he has sought replacement through  outside group and is currently on TRT.  Orders & Medications Meds ordered this encounter  Medications   desvenlafaxine (PRISTIQ) 50 MG 24 hr tablet    Sig: Take 1 tablet (50 mg total) by mouth daily.    Dispense:  90 tablet    Refill:  3   No orders of the defined types were placed in this encounter.    I discussed the above assessment and treatment plan with the patient. The patient was provided an opportunity to ask questions and all were answered. The patient agreed with the plan and demonstrated an understanding of the instructions.   The patient was advised to call back or seek an in-person evaluation if the symptoms worsen or if the condition fails to improve as anticipated.   I provided a total time of 30 minutes including both face-to-face and non-face-to-face time on 05/07/2022 inclusive of time utilized for medical chart review, information gathering, care coordination with staff, and documentation completion.    Adrian Banana, MD   Primary Care Sports Medicine [facility]

## 2022-05-07 NOTE — Assessment & Plan Note (Signed)
Patient did have serum testosterone level drawn at 12:03 in normal range, he has sought replacement through outside group and is currently on TRT.

## 2022-05-07 NOTE — Assessment & Plan Note (Signed)
Symptoms well controlled on current regimen, refilled.

## 2022-07-13 IMAGING — US US LIVER ELASTOGRAPHY
2 series · 12 of 25 positions shown · non-contrast
Comparison: None.

CLINICAL DATA: Abnormal liver function tests

EXAM:
US LIVER ELASTOGRAPHY
TECHNIQUE: Sonography of the liver was performed. In addition, ultrasound
elastography evaluation of the liver was performed. A region of
interest was placed within the right lobe of the liver. Following
application of a compressive sonographic pulse, tissue
compressibility was assessed. Multiple assessments were performed at
the selected site. Median tissue compressibility was determined.
Previously, hepatic stiffness was assessed by shear wave velocity.
Based on recently published Society of Radiologists in Ultrasound
consensus article, reporting is now recommended to be performed in
the SI units of pressure (kiloPascals) representing hepatic
stiffness/elasticity. The obtained result is compared to the
published reference standards. (cACLD = compensated Advanced Chronic
Liver Disease)

[Series 1: us elastography liver · 5 of 17 slices shown (1 of 2)]
[im 2/17]
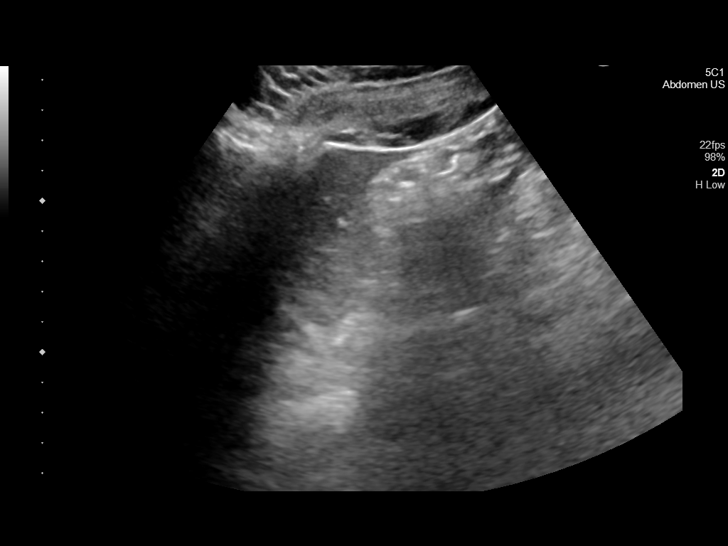
[im 5/17]
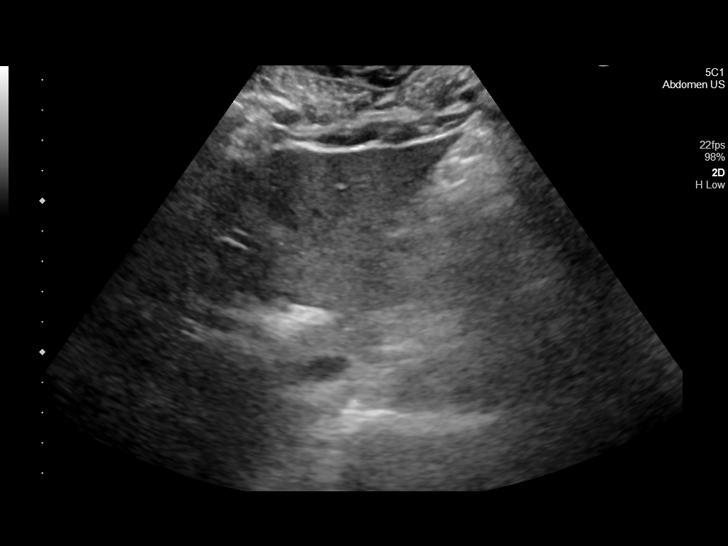
[im 9/17]
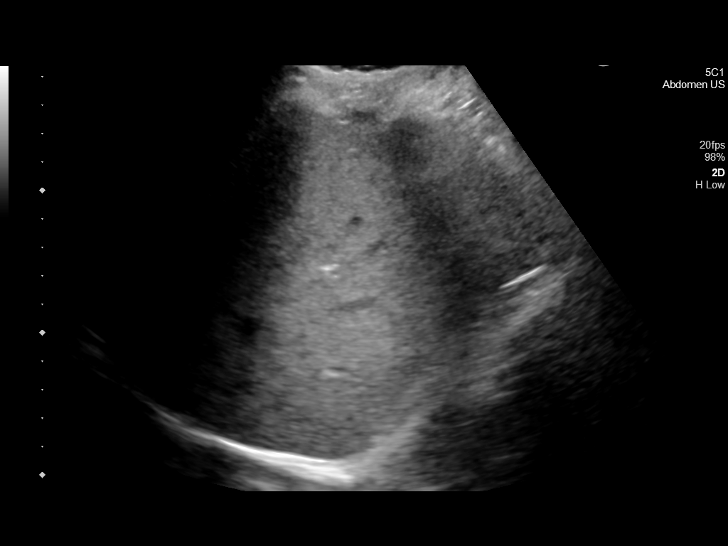
[im 12/17]
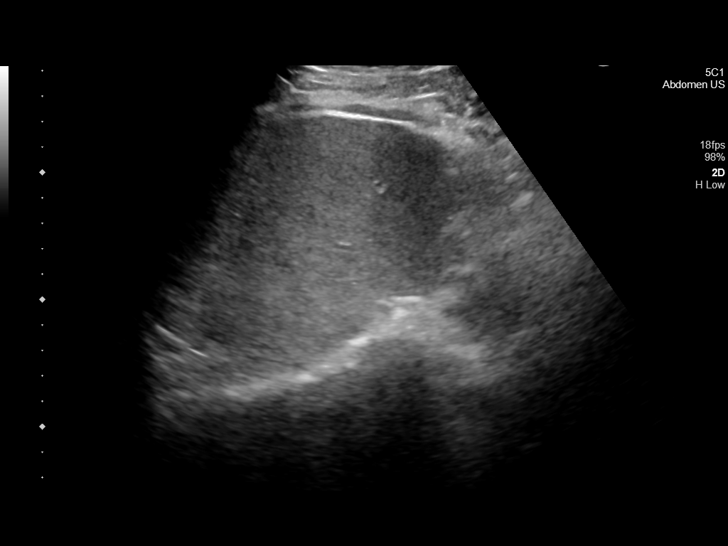
[im 15/17]
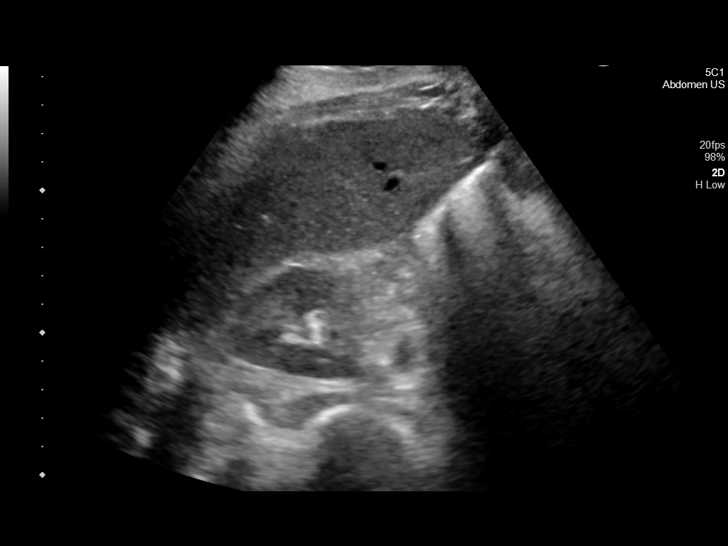

[Series 1001: us elastography liver · 7 of 21 slices shown (2 of 2)]
[im 1/21]
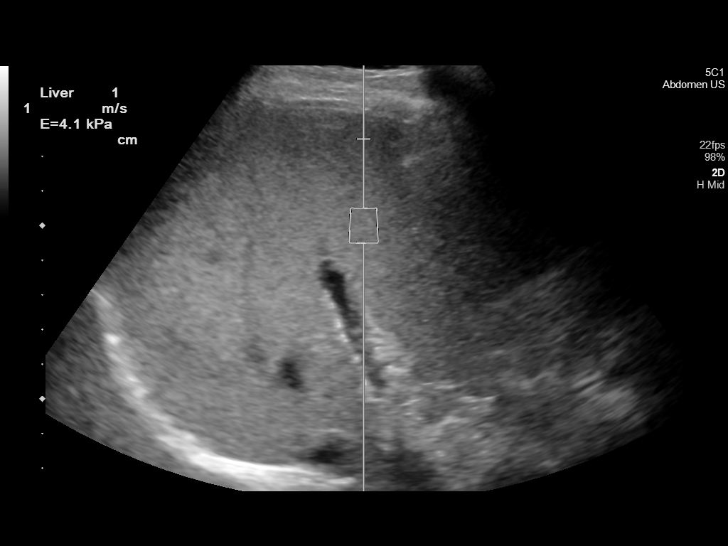
[im 4/21]
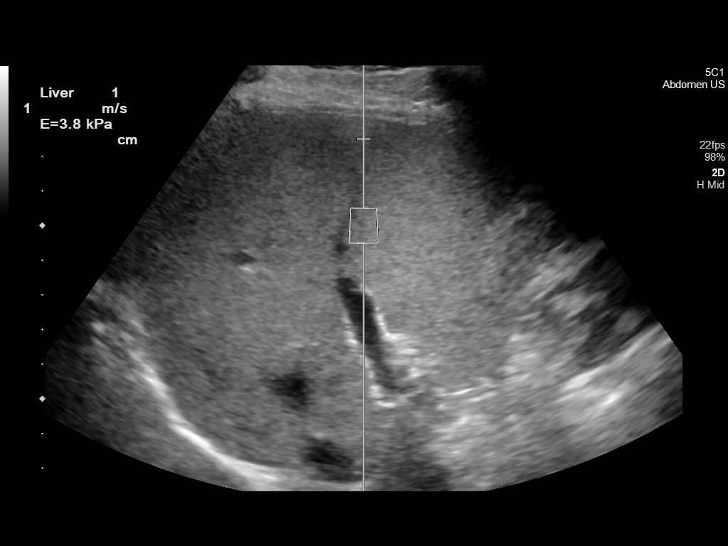
[im 7/21]
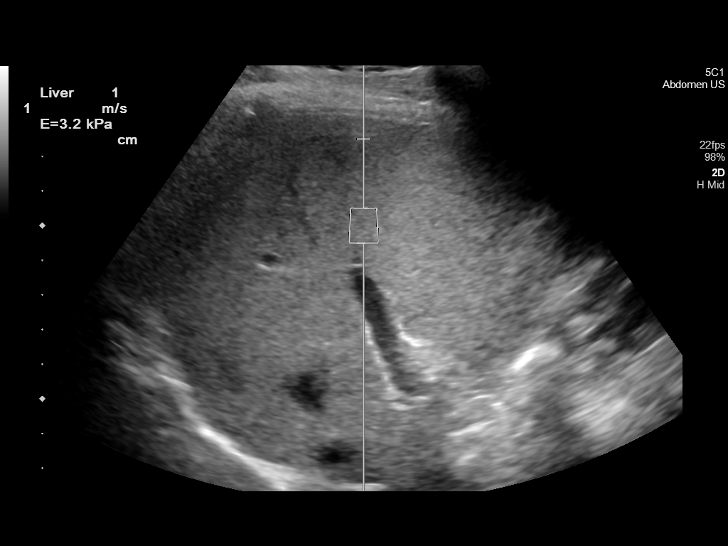
[im 10/21]
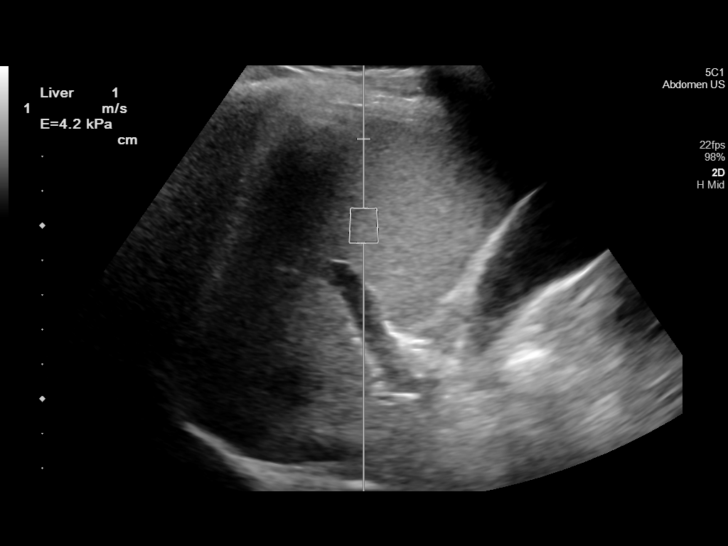
[im 13/21]
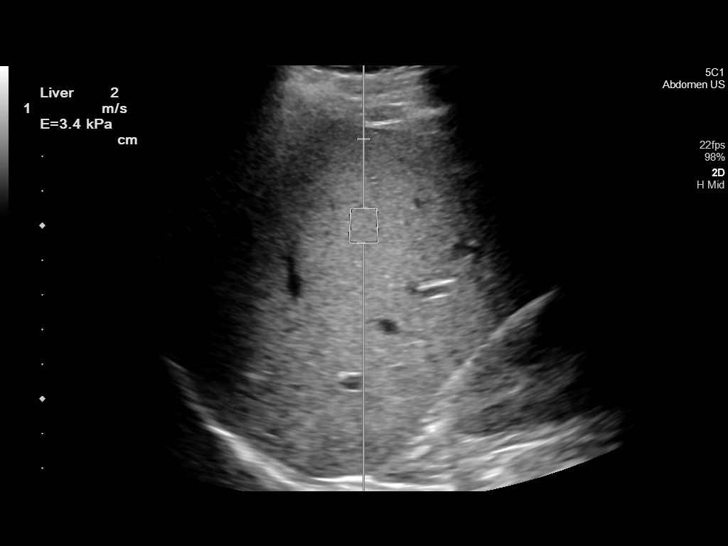
[im 16/21]
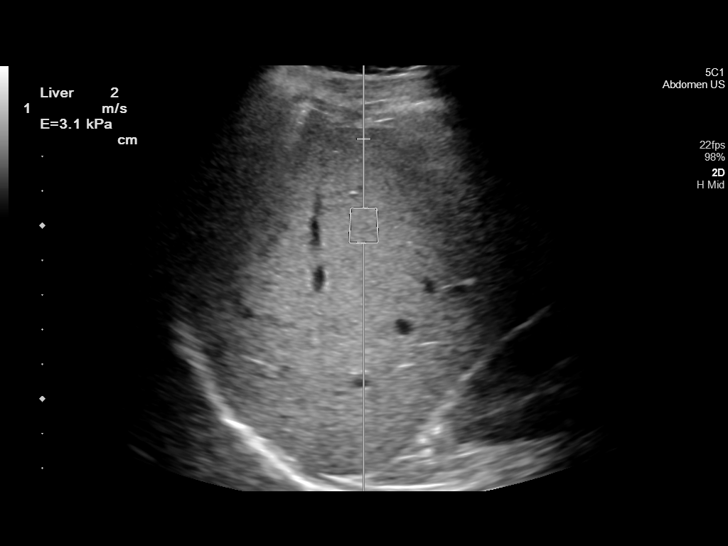
[im 19/21]
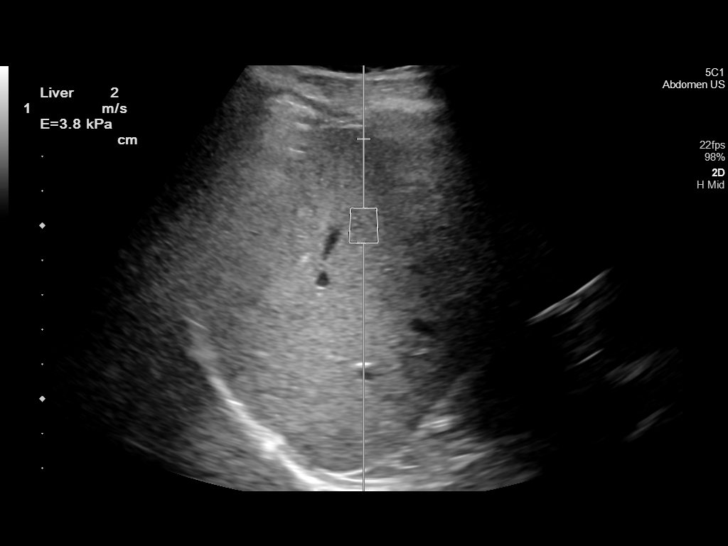

[12 of 25 positions shown; findings below may reference images not displayed]

FINDINGS: Liver: There is increased echogenicity suggesting fatty
infiltration. No focal abnormality is seen. There is no definite
nodularity in the liver surface. Portal vein is patent on color
Doppler imaging with normal direction of blood flow towards the
liver.

ULTRASOUND HEPATIC ELASTOGRAPHY

Device: Siemens Helix VTQ

Patient position: Supine

Transducer: 5 CL

Number of measurements: 10

Hepatic segment:  8

Median kPa:

IQR:

IQR/Median kPa ratio:

Data quality:

Diagnostic category:

The use of hepatic elastography is applicable to patients with viral
hepatitis and non-alcoholic fatty liver disease. At this time, there
is insufficient data for the referenced cut-off values and use in
other causes of liver disease, including alcoholic liver disease.
Patients, however, may be assessed by elastography and serve as
their own reference standard/baseline.

In patients with non-alcoholic liver disease, the values suggesting
compensated advanced chronic liver disease (cACLD) may be lower, and
patients may need additional testing with elasticity results of [DATE]
kPa.

Please note that abnormal hepatic elasticity and shear wave
velocities may also be identified in clinical settings other than
with hepatic fibrosis, such as: acute hepatitis, elevated right
heart and central venous pressures including use of beta blockers,
Del disease (Yuan), infiltrative processes such as
mastocytosis/amyloidosis/infiltrative tumor/lymphoma, extrahepatic
cholestasis, with hyperemia in the post-prandial state, and with
liver transplantation. Correlation with patient history, laboratory
data, and clinical condition recommended.

Diagnostic Categories:

< or =5 kPa: high probability of being normal

< or =9 kPa: in the absence of other known clinical signs, rules [DATE] kPa and ?13 kPa: suggestive of cACLD, but needs further testing

>13 kPa: highly suggestive of cACLD

> or =17 kPa: highly suggestive of cACLD with an increased
probability of clinically significant portal hypertension
IMPRESSION: ULTRASOUND LIVER:

There is fatty infiltration in the liver.

ULTRASOUND HEPATIC ELASTOGRAPHY:

Median kPa:

Diagnostic category:  High probability of being normal

## 2022-07-14 ENCOUNTER — Emergency Department
Admission: EM | Admit: 2022-07-14 | Discharge: 2022-07-14 | Disposition: A | Discharge status: Home / Self Care | Payer: Self-pay | Attending: Student in an Organized Health Care Education/Training Program | Admitting: Student in an Organized Health Care Education/Training Program

## 2022-07-14 ENCOUNTER — Other Ambulatory Visit: Payer: Self-pay

## 2022-07-14 ENCOUNTER — Emergency Department: Payer: Self-pay

## 2022-07-14 DIAGNOSIS — R0602 Shortness of breath: Secondary | ICD-10-CM | POA: Insufficient documentation

## 2022-07-14 DIAGNOSIS — F419 Anxiety disorder, unspecified: Secondary | ICD-10-CM | POA: Diagnosis not present

## 2022-07-14 DIAGNOSIS — R0789 Other chest pain: Secondary | ICD-10-CM | POA: Insufficient documentation

## 2022-07-14 DIAGNOSIS — R079 Chest pain, unspecified: Secondary | ICD-10-CM | POA: Diagnosis present

## 2022-07-14 LAB — BASIC METABOLIC PANEL
Anion gap: 7 (ref 5–15)
BUN: 10 mg/dL (ref 6–20)
CO2: 26 mmol/L (ref 22–32)
Calcium: 8.7 mg/dL — ABNORMAL LOW (ref 8.9–10.3)
Chloride: 105 mmol/L (ref 98–111)
Creatinine, Ser: 0.98 mg/dL (ref 0.61–1.24)
GFR, Estimated: >60 mL/min (ref >60)
Glucose, Bld: 121 mg/dL — ABNORMAL HIGH (ref 70–99)
Potassium: 4.3 mmol/L (ref 3.5–5.1)
Sodium: 138 mmol/L (ref 135–145)

## 2022-07-14 LAB — HEPATIC FUNCTION PANEL
ALT: 24 U/L (ref 0–44)
AST: 28 U/L (ref 15–41)
Albumin: 4.1 g/dL (ref 3.5–5.0)
Alkaline Phosphatase: 90 U/L (ref 38–126)
Bilirubin, Direct: 0.1 mg/dL (ref 0.0–0.2)
Indirect Bilirubin: 0.6 mg/dL (ref 0.3–0.9)
Total Bilirubin: 0.7 mg/dL (ref 0.3–1.2)
Total Protein: 7.1 g/dL (ref 6.5–8.1)

## 2022-07-14 LAB — CBC
HCT: 50.9 % (ref 39.0–52.0)
Hemoglobin: 16.6 g/dL (ref 13.0–17.0)
MCH: 29.3 pg (ref 26.0–34.0)
MCHC: 32.6 g/dL (ref 30.0–36.0)
MCV: 89.9 fL (ref 80.0–100.0)
Platelets: 265 10*3/uL (ref 150–400)
RBC: 5.66 MIL/uL (ref 4.22–5.81)
RDW: 13.6 % (ref 11.5–15.5)
WBC: 6 10*3/uL (ref 4.0–10.5)
nRBC: 0 % (ref 0.0–0.2)

## 2022-07-14 LAB — TROPONIN I (HIGH SENSITIVITY)
Troponin I (High Sensitivity): 3 ng/L (ref <18)
Troponin I (High Sensitivity): 3 ng/L (ref <18)

## 2022-07-14 LAB — D-DIMER, QUANTITATIVE: D-Dimer, Quant: <0.27 ug/mL-FEU (ref 0.00–0.50)

## 2022-07-14 LAB — LIPASE, BLOOD: Lipase: 34 U/L (ref 11–51)

## 2022-07-14 MED ORDER — DIAZEPAM 5 MG PO TABS: 5.0000 mg | ORAL_TABLET | Freq: Three times a day (TID) | ORAL | 0 refills | Status: DC | PRN

## 2022-07-14 MED ORDER — DIAZEPAM 5 MG PO TABS
5.0000 mg | ORAL_TABLET | Freq: Once | ORAL | Status: AC
  Administered 2022-07-14: 5 mg via ORAL
  Filled 2022-07-14: qty 1

## 2022-07-14 NOTE — ED Notes (Signed)
Repeat blue top sent to lab 

## 2022-07-14 NOTE — ED Triage Notes (Signed)
Midsternal chest pain x 4 days. Pt states " its like something is ripping my chest." Endorses sob, worsening with exertion. Pt states this morning he felt like he needed to vomit in order to breathe. Denies back pain, Denies abd pain.

## 2022-07-14 NOTE — ED Provider Notes (Signed)
[facility] Provider Note    Event Date/Time   First MD Initiated Contact with Patient 07/14/22 1133     (approximate)   History   Chest Pain   HPI  Adrian Lee is a 51 y.o. male presents to the ER for evaluation of 4 days of shortness of breath and difficulty breathing.  Patient states that he is not been able to sleep much over the past 2 nights.  Does feel stressed and anxious.  He does not have a history of high blood pressure or diabetes no history of heart disease.  No recent prolonged travel.  He does not smoke.  He is fairly active.  Had a history of bronchitis when he was young child but has not had issues since then.     Physical Exam   Triage Vital Signs: ED Triage Vitals  Enc Vitals Group     BP --      Pulse --      Resp --      Temp --      Temp src --      SpO2 --      Weight 07/14/22 1122 160 lb (72.6 kg)     Height 07/14/22 1122 5\' 9"  (1.753 m)     Head Circumference --      Peak Flow --      Pain Score 07/14/22 1121 10     Pain Loc --      Pain Edu? --      Excl. in GC? --     Most recent vital signs: Vitals:   07/14/22 1400 07/14/22 1430  BP: 113/75 117/80  Pulse: 69 60  Resp: 13 19  Temp:    SpO2: 98% 100%     Constitutional: Alert, anxious appearing Eyes: Conjunctivae are normal.  Head: Atraumatic. Nose: No congestion/rhinnorhea. Mouth/Throat: Mucous membranes are moist.   Neck: Painless ROM.  Cardiovascular:   Good peripheral circulation. No m/g/r Respiratory: Normal respiratory effort.  No retractions. No wheeze Gastrointestinal: Soft and nontender.  Musculoskeletal:  no deformity Neurologic:  MAE spontaneously. No gross focal neurologic deficits are appreciated.  Skin:  Skin is warm, dry and intact. No rash noted. Psychiatric: Mood and affect are normal. Speech and behavior are normal.    ED Results / Procedures / Treatments   Labs (all labs ordered are listed, but only abnormal results are  displayed) Labs Reviewed  BASIC METABOLIC PANEL - Abnormal; Notable for the following components:      Result Value   Glucose, Bld 121 (*)    Calcium 8.7 (*)    All other components within normal limits  CBC  HEPATIC FUNCTION PANEL  LIPASE, BLOOD  D-DIMER, QUANTITATIVE (NOT AT [facility])  TROPONIN I (HIGH SENSITIVITY)  TROPONIN I (HIGH SENSITIVITY)     EKG  ED ECG REPORT I, [facility], the attending physician, personally viewed and interpreted this ECG.   Date: 07/14/2022  EKG Time: 11:22  Rate: 80  Rhythm: sinus  Axis: normal  Intervals:  normal  ST&T Change: no stemi, no depressions    RADIOLOGY Please see ED Course for my review and interpretation.  I personally reviewed all radiographic images ordered to evaluate for the above acute complaints and reviewed radiology reports and findings.  These findings were personally discussed with the patient.  Please see medical record for radiology report.    PROCEDURES:  Critical Care performed:   Procedures   MEDICATIONS ORDERED IN ED: Medications  diazepam (VALIUM) tablet 5 mg (5 mg Oral Given 07/14/22 1155)     IMPRESSION / MDM / ASSESSMENT AND PLAN / ED COURSE  I reviewed the triage vital signs and the nursing notes.                              Differential diagnosis includes, but is not limited to, ACS, pericarditis, esophagitis, boerhaaves, pe, dissection, pna, bronchitis, costochondritis  Patient presented to the ER for evaluation of symptoms as described above.  This presenting complaint could reflect a potentially life-threatening illness therefore the patient will be placed on continuous pulse oximetry and telemetry for monitoring.  Laboratory evaluation will be sent to evaluate for the above complaints.     Clinical Course as of 07/14/22 1454  Sat Jul 14, 2022  1247 Chest x-ray on my review and interpretation does not show any evidence of pneumothorax or consolidation. [PR]  1253 Patient feels  significantly improved after Valium.  I do suspect stress reaction or panic attack.  Given his age and risk factors will observe for repeat troponin but I have a much lower suspicion for ACS.  Is not consistent with dissection.  His abdominal exam is soft and benign not consistent with acute intra-abdominal process. [PR]  1434 [place] is negative.  Patient does appear stable and appropriate for outpatient follow-up. [PR]    Clinical Course User Index [PR] Willy Eddy, MD    FINAL CLINICAL IMPRESSION(S) / ED DIAGNOSES   Final diagnoses:  Atypical chest pain  Anxiety     Rx / DC Orders   ED Discharge Orders          Ordered    diazepam (VALIUM) 5 MG tablet  Every 8 hours PRN        07/14/22 1435             Note:  This document was prepared using Dragon voice recognition software and may include unintentional dictation errors.    Willy Eddy, MD 07/14/22 [phone]

## 2022-08-31 ENCOUNTER — Telehealth: Payer: Self-pay

## 2022-08-31 ENCOUNTER — Other Ambulatory Visit: Payer: Self-pay

## 2022-08-31 DIAGNOSIS — Z1211 Encounter for screening for malignant neoplasm of colon: Secondary | ICD-10-CM

## 2022-08-31 MED ORDER — NA SULFATE-K SULFATE-MG SULF 17.5-3.13-1.6 GM/177ML PO SOLN: 1.0000 | Freq: Once | ORAL | 0 refills | Status: AC

## 2022-08-31 NOTE — Telephone Encounter (Signed)
Gastroenterology Pre-Procedure Review  Request Date: 09/25/22 Requesting Physician: Dr. Tobi Bastos  PATIENT REVIEW QUESTIONS: The patient responded to the following health history questions as indicated:    1. Are you having any GI issues? yes (hemorrhoids) 2. Do you have a personal history of Polyps? no 3. Do you have a family history of Colon Cancer or Polyps? no 4. Diabetes Mellitus? no 5. Joint replacements in the past 12 months?no 6. Major health problems in the past 3 months?ER Visit 07/14/22 Atypical Chest Pain, Anxiety 7. Any artificial heart valves, MVP, or defibrillator?no    MEDICATIONS & ALLERGIES:    Patient reports the following regarding taking any anticoagulation/antiplatelet therapy:   Plavix, Coumadin, Eliquis, Xarelto, Lovenox, Pradaxa, Brilinta, or Effient? no Aspirin? no  Patient confirms/reports the following medications:  Current Outpatient Medications  Medication Sig Dispense Refill   desvenlafaxine (PRISTIQ) 50 MG 24 hr tablet Take 1 tablet (50 mg total) by mouth daily. 90 tablet 3   diazepam (VALIUM) 5 MG tablet Take 1 tablet (5 mg total) by mouth every 8 (eight) hours as needed for anxiety. 8 tablet 0   Multiple Vitamin (MULTIVITAMIN ADULT PO) Take 1 tablet by mouth daily.     OMEPRAZOLE PO Take 1 capsule by mouth as needed.     sildenafil (REVATIO) 20 MG tablet 1-5 tablets as needed one hour prior to intercourse. 30 tablet 6   No current facility-administered medications for this visit.    Patient confirms/reports the following allergies:  No Known Allergies  No orders of the defined types were placed in this encounter.   AUTHORIZATION INFORMATION Primary Insurance: 1D#: Group #:  Secondary Insurance: 1D#: Group #:  SCHEDULE INFORMATION: Date: 09/25/22 Time: Location: [facility]

## 2022-09-24 ENCOUNTER — Encounter: Payer: Self-pay | Admitting: Gastroenterology

## 2022-09-25 ENCOUNTER — Ambulatory Visit
Admission: RE | Admit: 2022-09-25 | Discharge: 2022-09-25 | Disposition: A | Discharge status: Home / Self Care | Payer: Self-pay | Attending: Gastroenterology | Admitting: Gastroenterology

## 2022-09-25 ENCOUNTER — Encounter
Admission: RE | Disposition: A | Discharge status: Home / Self Care | Payer: Self-pay | Source: Home / Self Care | Attending: Gastroenterology

## 2022-09-25 ENCOUNTER — Ambulatory Visit: Payer: Self-pay | Admitting: Anesthesiology

## 2022-09-25 ENCOUNTER — Other Ambulatory Visit: Payer: Self-pay

## 2022-09-25 ENCOUNTER — Encounter: Payer: Self-pay | Admitting: Gastroenterology

## 2022-09-25 DIAGNOSIS — Z87891 Personal history of nicotine dependence: Secondary | ICD-10-CM | POA: Insufficient documentation

## 2022-09-25 DIAGNOSIS — K219 Gastro-esophageal reflux disease without esophagitis: Secondary | ICD-10-CM | POA: Insufficient documentation

## 2022-09-25 DIAGNOSIS — Z538 Procedure and treatment not carried out for other reasons: Secondary | ICD-10-CM | POA: Insufficient documentation

## 2022-09-25 DIAGNOSIS — Z1211 Encounter for screening for malignant neoplasm of colon: Secondary | ICD-10-CM

## 2022-09-25 HISTORY — PX: COLONOSCOPY WITH PROPOFOL: SHX5780

## 2022-09-25 LAB — URINE DRUG SCREEN, QUALITATIVE (ARMC ONLY)
Amphetamines, Ur Screen: NOT DETECTED
Barbiturates, Ur Screen: NOT DETECTED
Benzodiazepine, Ur Scrn: NOT DETECTED
Cannabinoid 50 Ng, Ur ~~LOC~~: NOT DETECTED
Cocaine Metabolite,Ur ~~LOC~~: NOT DETECTED
MDMA (Ecstasy)Ur Screen: NOT DETECTED
Methadone Scn, Ur: NOT DETECTED
Opiate, Ur Screen: NOT DETECTED
Phencyclidine (PCP) Ur S: NOT DETECTED
Tricyclic, Ur Screen: NOT DETECTED

## 2022-09-25 SURGERY — COLONOSCOPY WITH PROPOFOL
Anesthesia: General

## 2022-09-25 MED ORDER — PROPOFOL 10 MG/ML IV BOLUS
INTRAVENOUS | Status: AC
  Filled 2022-09-25: qty 20

## 2022-09-25 MED ORDER — PROPOFOL 10 MG/ML IV BOLUS
INTRAVENOUS | Status: DC | PRN
  Administered 2022-09-25: 70 mg via INTRAVENOUS

## 2022-09-25 MED ORDER — SODIUM CHLORIDE 0.9 % IV SOLN: INTRAVENOUS | Status: DC

## 2022-09-25 MED ORDER — PROPOFOL 500 MG/50ML IV EMUL
INTRAVENOUS | Status: DC | PRN
  Administered 2022-09-25: 200 ug/kg/min via INTRAVENOUS

## 2022-09-25 MED ORDER — SODIUM CHLORIDE 0.9 % IV SOLN: INTRAVENOUS | Status: DC | PRN

## 2022-09-25 NOTE — Anesthesia Postprocedure Evaluation (Signed)
Anesthesia Post Note  Patient: Adrian Lee  Procedure(s) Performed: COLONOSCOPY WITH PROPOFOL  Patient location during evaluation: Endoscopy Anesthesia Type: General Level of consciousness: awake and alert Pain management: pain level controlled Vital Signs Assessment: post-procedure vital signs reviewed and stable Respiratory status: spontaneous breathing, nonlabored ventilation, respiratory function stable and patient connected to nasal cannula oxygen Cardiovascular status: blood pressure returned to baseline and stable Postop Assessment: no apparent nausea or vomiting Anesthetic complications: no   No notable events documented.   Last Vitals:  Vitals:   09/25/22 1019 09/25/22 1029  BP: 100/75 102/71  Pulse: 72 65  Resp: 19 20  Temp: (!) 36.1 C   SpO2: 98% 100%    Last Pain:  Vitals:   09/25/22 1029  TempSrc:   PainSc: 0-No pain                 Ilene Qua

## 2022-09-25 NOTE — Op Note (Signed)
[facility] Gastroenterology Patient Name: Adrian Lee Procedure Date: 09/25/2022 10:08 AM MRN: [id number] Account #: [id number] Date of Birth: [birth year] Admit Type: Outpatient Age: 51 Room: [facility] ENDO ROOM 2 Gender: Male Note Status: Finalized Instrument Name: Jasper Riling [id number] Procedure:             Colonoscopy Indications:           Screening for colorectal malignant neoplasm Providers:             Jonathon Bellows MD, MD Referring MD:          No Local Md, MD (Referring MD) Medicines:             Monitored Anesthesia Care Complications:         No immediate complications. Procedure:             Pre-Anesthesia Assessment:                        - Prior to the procedure, a History and Physical was                         performed, and patient medications, allergies and                         sensitivities were reviewed. The patient's tolerance                         of previous anesthesia was reviewed.                        - The risks and benefits of the procedure and the                         sedation options and risks were discussed with the                         patient. All questions were answered and informed                         consent was obtained.                        - ASA Grade Assessment: II - A patient with mild                         systemic disease.                        After obtaining informed consent, the colonoscope was                         passed under direct vision. Throughout the procedure,                         the patient's blood pressure, pulse, and oxygen                         saturations were monitored continuously. The                         Colonoscope was  introduced through the anus with the                         intention of advancing to the cecum. The scope was                         advanced to the sigmoid colon before the procedure was                         aborted. Medications were given. The  colonoscopy was                         performed with ease. The patient tolerated the                         procedure well. The quality of the bowel preparation                         was inadequate. Findings:      The perianal and digital rectal examinations were normal.      A moderate amount of semi-solid stool was found in the rectum and in the       sigmoid colon, interfering with visualization. Impression:            - Preparation of the colon was inadequate.                        - Stool in the rectum and in the sigmoid colon.                        - No specimens collected. Recommendation:        - Discharge patient to home (with escort).                        - Resume previous diet.                        - Continue present medications.                        - Repeat colonoscopy in 2 weeks because the bowel                         preparation was suboptimal. Procedure Code(s):     --- Professional ---                        [phone], 53, Colonoscopy, flexible; diagnostic,                         including collection of specimen(s) by brushing or                         washing, when performed (separate procedure) Diagnosis Code(s):     --- Professional ---                        Z12.11, Encounter for screening for malignant neoplasm  of colon CPT copyright 2019 American Medical Association. All rights reserved. The codes documented in this report are preliminary and upon coder review may  be revised to meet current compliance requirements. [name], MD [name] MD, MD 09/25/2022 10:14:54 AM This report has been signed electronically. Number of Addenda: 0 Note Initiated On: 09/25/2022 10:08 AM Total Procedure Duration: 0 hours 0 minutes 33 seconds  Estimated Blood Loss:  Estimated blood loss: none.      [facility]

## 2022-09-25 NOTE — H&P (Signed)
[name], MD [address], [address], [place], [place], [address] [address], [place], [place], [address] Phone: [phone]  Fax: [phone]  Primary Care Physician:  Jerrol Banana, MD   Pre-Procedure History & Physical: HPI:  Adrian Lee is a 51 y.o. male is here for an colonoscopy.   Past Medical History:  Diagnosis Date   Flatulence 12/27/2021   GERD (gastroesophageal reflux disease)    Hyperlipidemia     Past Surgical History:  Procedure Laterality Date   DENTAL SURGERY     ROOT CANAL     WISDOM TOOTH EXTRACTION Bilateral     Prior to Admission medications   Medication Sig Start Date End Date Taking? Authorizing Provider  desvenlafaxine (PRISTIQ) 50 MG 24 hr tablet Take 1 tablet (50 mg total) by mouth daily. 05/07/22   Jerrol Banana, MD  diazepam (VALIUM) 5 MG tablet Take 1 tablet (5 mg total) by mouth every 8 (eight) hours as needed for anxiety. 07/14/22 07/14/23  Willy Eddy, MD  Multiple Vitamin (MULTIVITAMIN ADULT PO) Take 1 tablet by mouth daily.    [provider]  OMEPRAZOLE PO Take 1 capsule by mouth as needed.    [provider]  sildenafil (REVATIO) 20 MG tablet 1-5 tablets as needed one hour prior to intercourse. 02/15/22   Vanna Scotland, MD    Allergies as of 08/31/2022   (No Known Allergies)    Family History  Problem Relation Age of Onset   Diabetes Mother     Social History   Socioeconomic History   Marital status: Married    Spouse name: Lynda Rainwater   Number of children: 5   Years of education: 7   Highest education level: 7th grade  Occupational History   Not on file  Tobacco Use   Smoking status: Former    Types: Cigarettes    Quit date: 2017    Years since quitting: 6.7    Passive exposure: Never   Smokeless tobacco: Never  Vaping Use   Vaping Use: Never used  Substance and Sexual Activity   Alcohol use: Not Currently   Drug use: Not Currently   Sexual activity: Yes     Partners: Female  Other Topics Concern   Not on file  Social History Narrative   Not on file   Social Determinants of Health   Financial Resource Strain: Not on file  Food Insecurity: Not on file  Transportation Needs: Not on file  Physical Activity: Not on file  Stress: Not on file  Social Connections: Not on file  Intimate Partner Violence: Not on file    Review of Systems: See HPI, otherwise negative ROS  Physical Exam: BP 111/87   Pulse 75   Temp (!) 97 F (36.1 C) (Temporal)   Resp 18   Ht 5\' 9"  (1.753 m)   Wt 74.4 kg   SpO2 97%   BMI 24.22 kg/m  General:   Alert,  pleasant and cooperative in NAD Head:  Normocephalic and atraumatic. Neck:  Supple; no masses or thyromegaly. Lungs:  Clear throughout to auscultation, normal respiratory effort.    Heart:  +S1, +S2, Regular rate and rhythm, No edema. Abdomen:  Soft, nontender and nondistended. Normal bowel sounds, without guarding, and without rebound.   Neurologic:  Alert and  oriented x4;  grossly normal neurologically.  Impression/Plan: Adrian Lee is here for an colonoscopy to be performed for Screening colonoscopy average risk   Risks, benefits, limitations,  and alternatives regarding  colonoscopy have been reviewed with the patient.  Questions have been answered.  All parties agreeable.   Jonathon Bellows, MD  09/25/2022, 10:07 AM

## 2022-09-25 NOTE — Transfer of Care (Signed)
Immediate Anesthesia Transfer of Care Note  Patient: Adrian Lee  Procedure(s) Performed: COLONOSCOPY WITH PROPOFOL  Patient Location: PACU  Anesthesia Type:General  Level of Consciousness: sedated  Airway & Oxygen Therapy: Patient Spontanous Breathing and Patient connected to nasal cannula oxygen  Post-op Assessment: Report given to RN and Post -op Vital signs reviewed and stable  Post vital signs: Reviewed and stable  Last Vitals:  Vitals Value Taken Time  BP    Temp    Pulse 70 09/25/22 1018  Resp 16 09/25/22 1018  SpO2 99 % 09/25/22 1018  Vitals shown include unvalidated device data.  Last Pain:  Vitals:   09/25/22 0943  TempSrc: Temporal  PainSc: 0-No pain         Complications: No notable events documented.

## 2022-09-25 NOTE — Anesthesia Preprocedure Evaluation (Signed)
Anesthesia Evaluation  Patient identified by MRN, date of birth, ID band Patient awake    Reviewed: Allergy & Precautions, NPO status , Patient's Chart, lab work & pertinent test results  Airway Mallampati: I  TM Distance: >3 FB Neck ROM: full    Dental  (+) Teeth Intact   Pulmonary neg pulmonary ROS, former smoker,    Pulmonary exam normal        Cardiovascular negative cardio ROS Normal cardiovascular exam     Neuro/Psych negative neurological ROS  negative psych ROS   GI/Hepatic GERD  Medicated,(+)       cocaine use,   Endo/Other  negative endocrine ROS  Renal/GU negative Renal ROS  negative genitourinary   Musculoskeletal  (+) narcotic dependent  Abdominal   Peds  Hematology negative hematology ROS (+)   Anesthesia Other Findings Past Medical History: 12/27/2021: Flatulence No date: GERD (gastroesophageal reflux disease) No date: Hyperlipidemia  Past Surgical History: No date: ROOT CANAL No date: WISDOM TOOTH EXTRACTION; Bilateral     Reproductive/Obstetrics negative OB ROS                           Anesthesia Physical Anesthesia Plan  ASA: 2  Anesthesia Plan: General   Post-op Pain Management: Minimal or no pain anticipated   Induction: Intravenous  PONV Risk Score and Plan: Propofol infusion and TIVA  Airway Management Planned: Natural Airway and Nasal Cannula  Additional Equipment:   Intra-op Plan:   Post-operative Plan:   Informed Consent: I have reviewed the patients History and Physical, chart, labs and discussed the procedure including the risks, benefits and alternatives for the proposed anesthesia with the patient or authorized representative who has indicated his/her understanding and acceptance.     Dental Advisory Given  Plan Discussed with: Anesthesiologist, CRNA and Surgeon  Anesthesia Plan Comments: (Patient consented for risks of anesthesia  including but not limited to:  - adverse reactions to medications - risk of airway placement if required - damage to eyes, teeth, lips or other oral mucosa - nerve damage due to positioning  - sore throat or hoarseness - Damage to heart, brain, nerves, lungs, other parts of body or loss of life  Patient voiced understanding.)       Anesthesia Quick Evaluation

## 2022-09-25 NOTE — Anesthesia Procedure Notes (Signed)
Date/Time: 09/25/2022 10:13 AM  Performed by: Nelda Marseille, CRNAPre-anesthesia Checklist: Patient identified, Emergency Drugs available, Suction available, Patient being monitored and Timeout performed Oxygen Delivery Method: Nasal cannula

## 2022-09-27 ENCOUNTER — Encounter: Payer: Self-pay | Admitting: Gastroenterology

## 2022-12-21 ENCOUNTER — Encounter: Payer: Self-pay | Admitting: Family Medicine

## 2022-12-21 ENCOUNTER — Encounter: Payer: Self-pay | Admitting: Emergency Medicine

## 2022-12-21 ENCOUNTER — Emergency Department
Admission: EM | Admit: 2022-12-21 | Discharge: 2022-12-21 | Disposition: A | Discharge status: Home / Self Care | Payer: Self-pay | Attending: Emergency Medicine | Admitting: Emergency Medicine

## 2022-12-21 ENCOUNTER — Telehealth: Payer: Self-pay | Admitting: Family Medicine

## 2022-12-21 ENCOUNTER — Emergency Department: Payer: Self-pay

## 2022-12-21 ENCOUNTER — Ambulatory Visit (INDEPENDENT_AMBULATORY_CARE_PROVIDER_SITE_OTHER): Payer: Self-pay | Admitting: Family Medicine

## 2022-12-21 VITALS — BP 140/90 | HR 75 | Ht 69.0 in | Wt 166.0 lb

## 2022-12-21 DIAGNOSIS — F418 Other specified anxiety disorders: Secondary | ICD-10-CM

## 2022-12-21 DIAGNOSIS — J309 Allergic rhinitis, unspecified: Secondary | ICD-10-CM

## 2022-12-21 DIAGNOSIS — R0981 Nasal congestion: Secondary | ICD-10-CM | POA: Insufficient documentation

## 2022-12-21 DIAGNOSIS — J3489 Other specified disorders of nose and nasal sinuses: Secondary | ICD-10-CM | POA: Insufficient documentation

## 2022-12-21 DIAGNOSIS — R030 Elevated blood-pressure reading, without diagnosis of hypertension: Secondary | ICD-10-CM

## 2022-12-21 DIAGNOSIS — F419 Anxiety disorder, unspecified: Secondary | ICD-10-CM | POA: Insufficient documentation

## 2022-12-21 DIAGNOSIS — R0602 Shortness of breath: Secondary | ICD-10-CM | POA: Insufficient documentation

## 2022-12-21 LAB — BASIC METABOLIC PANEL
Anion gap: 5 (ref 5–15)
BUN: 19 mg/dL (ref 6–20)
CO2: 28 mmol/L (ref 22–32)
Calcium: 8.6 mg/dL — ABNORMAL LOW (ref 8.9–10.3)
Chloride: 104 mmol/L (ref 98–111)
Creatinine, Ser: 0.99 mg/dL (ref 0.61–1.24)
GFR, Estimated: >60 mL/min (ref >60)
Glucose, Bld: 107 mg/dL — ABNORMAL HIGH (ref 70–99)
Potassium: 3.8 mmol/L (ref 3.5–5.1)
Sodium: 137 mmol/L (ref 135–145)

## 2022-12-21 LAB — CBC
HCT: 47.8 % (ref 39.0–52.0)
Hemoglobin: 16.5 g/dL (ref 13.0–17.0)
MCH: 30.6 pg (ref 26.0–34.0)
MCHC: 34.5 g/dL (ref 30.0–36.0)
MCV: 88.5 fL (ref 80.0–100.0)
Platelets: 252 10*3/uL (ref 150–400)
RBC: 5.4 MIL/uL (ref 4.22–5.81)
RDW: 12.2 % (ref 11.5–15.5)
WBC: 9 10*3/uL (ref 4.0–10.5)
nRBC: 0 % (ref 0.0–0.2)

## 2022-12-21 LAB — TROPONIN I (HIGH SENSITIVITY): Troponin I (High Sensitivity): 4 ng/L (ref <18)

## 2022-12-21 MED ORDER — MOMETASONE FUROATE 50 MCG/ACT NA SUSP: NASAL | 6 refills | Status: DC

## 2022-12-21 MED ORDER — LORAZEPAM 2 MG/ML IJ SOLN: 1.0000 mg | Freq: Once | INTRAMUSCULAR | Status: DC

## 2022-12-21 MED ORDER — HYDROXYZINE PAMOATE 25 MG PO CAPS: 25.0000 mg | ORAL_CAPSULE | Freq: Three times a day (TID) | ORAL | 0 refills | Status: DC | PRN

## 2022-12-21 MED ORDER — DEXAMETHASONE SODIUM PHOSPHATE 10 MG/ML IJ SOLN: 10.0000 mg | Freq: Once | INTRAMUSCULAR | Status: DC

## 2022-12-21 NOTE — Assessment & Plan Note (Signed)
Will monitor given comorbid complaints.

## 2022-12-21 NOTE — Patient Instructions (Addendum)
-   Start Nasonex and use daily until seeing ENT clinic (if not available, get Flonase) - Start Claritin daily and use until seeing ENT (do not get "D" formulations) - Review information about allergies - Can use Vistaril / hydroxyzine as-needed for anxiety - Return in 3 months for physical

## 2022-12-21 NOTE — Assessment & Plan Note (Signed)
Chronic condition, significantly worsened over the past several weeks, had been battling viral illnesses, noticing shortness of air, gasping when sleeping, sinus pressure, has been utilizing OTC oxymetazoline for several days with expected progressive worsening after a few days.  Denies any fevers, chills, sensations of panic attack when symptomatic.  Examination with reassuring oxygen saturation, clear lung fields throughout, benign cardiac sounds, oropharynx with cobblestone pattern posteriorly, no exudate noted, nasopharynx with swollen, not erythematous turbinates, tympanic membranes and canals benign bilaterally, nontender sinuses.  Shotty lymphadenopathy cervical chains.  Clinical history and findings raise concern for uncontrolled allergic rhinitis, I have prescribed steroid nasal spray, recommended OTC daily antihistamine, referral to ENT to evaluate for comorbid OSA concern citing chronic snoring and low fatigue.

## 2022-12-21 NOTE — ED Notes (Signed)
Pt eloped, notified registration he was doing so

## 2022-12-21 NOTE — ED Triage Notes (Signed)
Pt presents via POV with complaints of nasal congestion and sinus pressure over the last month getting progressively worse. He notes at night he has SOB - he thinks he may have sleep apnea because he often wakes up gasping for air. He endorses CP upon waking to night due to the discomfort taking deep breaths. Denies fevers, chills, N/V/D.

## 2022-12-21 NOTE — ED Provider Notes (Signed)
[facility] Provider Note    Event Date/Time   First MD Initiated Contact with Patient 12/21/22 [phone]     (approximate)   History   Shortness of Breath   HPI  Adrian Lee is a 52 y.o. male with history of hyperlipidemia who presents to the emergency department with complaints of shortness of breath, nasal congestion and pressure, anxiety for the past month.  States he has been using Afrin every single day multiple times a day for nasal congestion and that at night he feels that if he does not use it he wakes up gasping for breath.  He states he has never been diagnosed with sleep apnea but does snore at night.  He states he does not feel like he is getting good sleep because of his shortness of breath and being anxious about not being able to breathe through his nose.  No fevers, cough or chest pain.   History provided by patient.    Past Medical History:  Diagnosis Date   Flatulence 12/27/2021   GERD (gastroesophageal reflux disease)    Hyperlipidemia     Past Surgical History:  Procedure Laterality Date   COLONOSCOPY WITH PROPOFOL N/A 09/25/2022   Procedure: COLONOSCOPY WITH PROPOFOL;  Surgeon: Jonathon Bellows, MD;  Location: [facility] ENDOSCOPY;  Service: Gastroenterology;  Laterality: N/A;   DENTAL SURGERY     ROOT CANAL     WISDOM TOOTH EXTRACTION Bilateral     MEDICATIONS:  Prior to Admission medications   Medication Sig Start Date End Date Taking? Authorizing Provider  desvenlafaxine (PRISTIQ) 50 MG 24 hr tablet Take 1 tablet (50 mg total) by mouth daily. 05/07/22   Montel Culver, MD  diazepam (VALIUM) 5 MG tablet Take 1 tablet (5 mg total) by mouth every 8 (eight) hours as needed for anxiety. 07/14/22 07/14/23  Merlyn Lot, MD  Multiple Vitamin (MULTIVITAMIN ADULT PO) Take 1 tablet by mouth daily.    [provider]  OMEPRAZOLE PO Take 1 capsule by mouth as needed.    [provider]  sildenafil (REVATIO) 20 MG tablet 1-5  tablets as needed one hour prior to intercourse. 02/15/22   Hollice Espy, MD    Physical Exam   Triage Vital Signs: ED Triage Vitals  Enc Vitals Group     BP 12/21/22 0038 (!) 124/90     Pulse Rate 12/21/22 0038 72     Resp 12/21/22 0038 20     Temp 12/21/22 0038 98.3 F (36.8 C)     Temp Source 12/21/22 0038 Oral     SpO2 --      Weight 12/21/22 0037 164 lb (74.4 kg)     Height 12/21/22 0037 5\' 9"  (1.753 m)     Head Circumference --      Peak Flow --      Pain Score 12/21/22 0039 2     Pain Loc --      Pain Edu? --      Excl. in [place]? --     Most recent vital signs: Vitals:   12/21/22 0038  BP: (!) 124/90  Pulse: 72  Resp: 20  Temp: 98.3 F (36.8 C)    CONSTITUTIONAL: Alert and oriented and responds appropriately to questions. Well-appearing; well-nourished HEAD: Normocephalic, atraumatic EYES: Conjunctivae clear, pupils appear equal, sclera nonicteric ENT: normal nose; moist mucous membranes NECK: Supple, normal ROM CARD: RRR; S1 and S2 appreciated; no murmurs, no clicks, no rubs, no gallops RESP: Normal chest excursion without  splinting or tachypnea; breath sounds clear and equal bilaterally; no wheezes, no rhonchi, no rales, no hypoxia or respiratory distress, speaking full sentences ABD/GI: Normal bowel sounds; non-distended; soft, non-tender, no rebound, no guarding, no peritoneal signs BACK: The back appears normal EXT: Normal ROM in all joints; no deformity noted, no edema; no cyanosis SKIN: Normal color for age and race; warm; no rash on exposed skin NEURO: Moves all extremities equally, normal speech PSYCH: The patient's mood and manner are appropriate.   ED Results / Procedures / Treatments   LABS: (all labs ordered are listed, but only abnormal results are displayed) Labs Reviewed  BASIC METABOLIC PANEL - Abnormal; Notable for the following components:      Result Value   Glucose, Bld 107 (*)    Calcium 8.6 (*)    All other components within  normal limits  RESP PANEL BY RT-PCR (RSV, FLU A&B, COVID)  [id number]  CBC  D-DIMER, QUANTITATIVE  TROPONIN I (HIGH SENSITIVITY)  TROPONIN I (HIGH SENSITIVITY)     EKG:  EKG Interpretation  Date/Time:  Friday December 21 2022 00:45:58 EST Ventricular Rate:  68 PR Interval:  186 QRS Duration: 100 QT Interval:  370 QTC Calculation: 393 R Axis:   60 Text Interpretation: Normal sinus rhythm Normal ECG When compared with ECG of 14-Jul-2022 11:22, T wave inversion now evident in Inferior leads T wave amplitude has decreased in Anterior leads Confirmed by Pryor Curia [phone]) on 12/21/2022 4:04:59 AM         RADIOLOGY: My personal review and interpretation of imaging: Chest x-ray clear.  I have personally reviewed all radiology reports.   DG Chest Port 1 View  Result Date: 12/21/2022 CLINICAL DATA:  Shortness of breath. EXAM: PORTABLE CHEST 1 VIEW COMPARISON:  July 14, 2022 FINDINGS: The heart size and mediastinal contours are within normal limits. There is no evidence of an acute infiltrate, pleural effusion or pneumothorax. The visualized skeletal structures are unremarkable. IMPRESSION: No active disease. Electronically Signed   By: Virgina Norfolk M.D.   On: 12/21/2022 01:14     PROCEDURES:  Critical Care performed: No      Procedures    IMPRESSION / MDM / ASSESSMENT AND PLAN / ED COURSE  I reviewed the triage vital signs and the nursing notes.    Pt here with complaints of sinus pressure, congestion for the past month and feeling anxious that he cannot breathe especially at night.  No diagnosis of sleep apnea.  The patient is on the cardiac monitor to evaluate for evidence of arrhythmia and/or significant heart rate changes.   DIFFERENTIAL DIAGNOSIS (includes but not limited to):   Rebound congestion from Afrin, viral illness, pneumonia, ACS, PE, anxiety, sleep apnea   Patient's presentation is most consistent with acute presentation with potential threat to life  or bodily function.   PLAN: Workup initiated from triage.  No leukocytosis, normal hemoglobin and electrolytes.  Troponin negative.  Chest x-ray reviewed and interpreted by myself and the radiologist and shows no acute abnormality.  Repeat troponin pending.  EKG does show some T wave changes in 3 and aVF.  Will also obtain D-dimer as he does have an S1Q3T3 although I suspect that his shortness of breath is likely due to rebound congestion from overuse of Afrin on top of anxiety with possible underlying sleep apnea.  Will give Ativan as he states his wife drove him here to the emergency department to see if this will help with his symptoms and Decadron to  help with any sinus pressure.  He agrees also to viral swabs today.   MEDICATIONS GIVEN IN ED: Medications  LORazepam (ATIVAN) injection 1 mg (has no administration in time range)  dexamethasone (DECADRON) injection 10 mg (has no administration in time range)     ED COURSE: Unfortunately patient had to leave the emergency department prior to getting his IV medications or the rest of his workup.  Slight delay in getting additional lab work or medications to patient given multiple acute patients brought back to flex with only 2 nurses.  Told staff that he had to go to work.  Patient left prior to me being able to see him and talk to him further.  I was not able to provide him with any medications.  He does have a PCP for close follow-up and was educated at length prior to leaving about discontinuation of Afrin and that this is likely causing worsening of his symptoms.   CONSULTS: Dispo was still pending.  Patient eloped without completing care.   OUTSIDE RECORDS REVIEWED: Reviewed patient's last cardiology visit for shortness of breath in October 2017.       FINAL CLINICAL IMPRESSION(S) / ED DIAGNOSES   Final diagnoses:  Sinus congestion  Anxiety     Rx / DC Orders   ED Discharge Orders     None        Note:  This document  was prepared using Dragon voice recognition software and may include unintentional dictation errors.   Dominiqua Cooner, Delice Bison, DO 12/21/22 [phone]

## 2022-12-21 NOTE — Telephone Encounter (Signed)
Copied from [employer] [id number]. Topic: Appointment Scheduling - Scheduling Inquiry for Clinic >> Dec 21, 2022 10:40 AM Tiffany B wrote: Reason for CRM: Patient was seen at [facility] on 12/20/2022, as per patient experiencing difficulty breathing when sleeping and was advised by the ED their were no findings but follow up with PCP to discuss sleep apnea.   Patient is not currently experiencing difficulty breathing and has been scheduled to see PCP today at 1:20pm.

## 2022-12-21 NOTE — Progress Notes (Signed)
Primary Care / Sports Medicine Office Visit  Patient Information:  Patient ID: Adrian Lee, male DOB: 1971/08/17 Age: 52 y.o. MRN: [id number]   Adrian Lee is a pleasant 52 y.o. male presenting with the following:  Chief Complaint  Patient presents with   Insomnia    SOB when sleeping, sinus pressure, using nasal spray with no relief, states has panic attack when he can't breathe     Vitals:   12/21/22 1305 12/21/22 1306  BP: (!) 140/110 (!) 140/90  Pulse: 75   SpO2: 98%    Vitals:   12/21/22 1305  Weight: 166 lb (75.3 kg)  Height: 5\' 9"  (1.753 m)   Body mass index is 24.51 kg/m.  DG Chest Port 1 View  Result Date: 12/21/2022 CLINICAL DATA:  Shortness of breath. EXAM: PORTABLE CHEST 1 VIEW COMPARISON:  July 14, 2022 FINDINGS: The heart size and mediastinal contours are within normal limits. There is no evidence of an acute infiltrate, pleural effusion or pneumothorax. The visualized skeletal structures are unremarkable. IMPRESSION: No active disease. Electronically Signed   By: Virgina Norfolk M.D.   On: 12/21/2022 01:14     Independent interpretation of notes and tests performed by another provider:   None  Procedures performed:   None  Pertinent History, Exam, Impression, and Recommendations:   Adrian Lee was seen today for insomnia.  Allergic rhinitis, unspecified seasonality, unspecified trigger Assessment & Plan: Chronic condition, significantly worsened over the past several weeks, had been battling viral illnesses, noticing shortness of air, gasping when sleeping, sinus pressure, has been utilizing OTC oxymetazoline for several days with expected progressive worsening after a few days.  Denies any fevers, chills, sensations of panic attack when symptomatic.  Examination with reassuring oxygen saturation, clear lung fields throughout, benign cardiac sounds, oropharynx with cobblestone pattern posteriorly, no exudate noted, nasopharynx with swollen, not  erythematous turbinates, tympanic membranes and canals benign bilaterally, nontender sinuses.  Shotty lymphadenopathy cervical chains.  Clinical history and findings raise concern for uncontrolled allergic rhinitis, I have prescribed steroid nasal spray, recommended OTC daily antihistamine, referral to ENT to evaluate for comorbid OSA concern citing chronic snoring and low fatigue.  Orders: -     Ambulatory referral to ENT -     Mometasone Furoate; One spray in each nostril twice a day, use left hand for right nostril, and right hand for left nostril.  Please dispense one bottle.  Dispense: 1 g; Refill: 6  Elevated blood pressure reading in office without diagnosis of hypertension Assessment & Plan: Will monitor given comorbid complaints.   Depression with anxiety Overview:    12/21/2022    1:10 PM 05/07/2022   11:08 AM 02/07/2022   10:08 AM 12/27/2021   10:19 AM 11/29/2021    2:08 PM  Depression screen PHQ 2/9  Decreased Interest 0 0 0 2 2  Down, Depressed, Hopeless 0 0 0 1 1  PHQ - 2 Score 0 0 0 3 3  Altered sleeping 2 0 0 2 2  Tired, decreased energy 3 0 1 2 3   Change in appetite 0 0 0 2 2  Feeling bad or failure about yourself  0 0 0 0 3  Trouble concentrating 0 0 0 1 2  Moving slowly or fidgety/restless 0 0 0 1 0  Suicidal thoughts 0 0 0 0 0  PHQ-9 Score 5 0 1 11 15   Difficult doing work/chores Not difficult at all Not difficult at all Not difficult at all Not difficult at  all Somewhat difficult      12/21/2022    1:10 PM 05/07/2022   11:09 AM 02/07/2022   10:08 AM 12/27/2021   10:19 AM  GAD 7 : Generalized Anxiety Score  Nervous, Anxious, on Edge 3 0 0 2  Control/stop worrying 0 0 1 2  Worry too much - different things 0 0 1 2  Trouble relaxing 0 0 1 2  Restless 0 0 0 1  Easily annoyed or irritable 0 0 1 2  Afraid - awful might happen 0 0 1 1  Total GAD 7 Score 3 0 5 12  Anxiety Difficulty Somewhat difficult Not difficult at all Not difficult at all     Assessment &  Plan: Chronic condition with recent exacerbation in setting of sleep issues, severe nasal congestion. Discussed options, is amenable to PRN hydroxyzine trial.  Orders: -     hydrOXYzine Pamoate; Take 1 capsule (25 mg total) by mouth every 8 (eight) hours as needed for anxiety.  Dispense: 30 capsule; Refill: 0     Orders & Medications Meds ordered this encounter  Medications   mometasone (NASONEX) 50 MCG/ACT nasal spray    Sig: One spray in each nostril twice a day, use left hand for right nostril, and right hand for left nostril.  Please dispense one bottle.    Dispense:  1 g    Refill:  6   hydrOXYzine (VISTARIL) 25 MG capsule    Sig: Take 1 capsule (25 mg total) by mouth every 8 (eight) hours as needed for anxiety.    Dispense:  30 capsule    Refill:  0   Orders Placed This Encounter  Procedures   Ambulatory referral to ENT     Return in about 3 months (around 03/22/2023) for CPE.     Montel Culver, MD, [facility]   Primary Care Sports Medicine Primary Care and Sports Medicine at [facility]

## 2022-12-21 NOTE — Assessment & Plan Note (Signed)
Chronic condition with recent exacerbation in setting of sleep issues, severe nasal congestion. Discussed options, is amenable to PRN hydroxyzine trial.

## 2022-12-21 NOTE — ED Notes (Signed)
Pt declined viral panel at this time.

## 2022-12-21 NOTE — ED Notes (Addendum)
Pt seen leaving his ER room, and told registration he needed to leave. Pt talked to this RN and stated he could not stay any longer. Dr. Leonides Schanz made aware of elopement

## 2023-03-18 ENCOUNTER — Ambulatory Visit
Admission: RE | Admit: 2023-03-18 | Discharge: 2023-03-18 | Disposition: A | Discharge status: Home / Self Care | Payer: Self-pay | Attending: Family Medicine | Admitting: Family Medicine

## 2023-03-18 ENCOUNTER — Ambulatory Visit: Payer: Self-pay | Admitting: Family Medicine

## 2023-03-18 ENCOUNTER — Ambulatory Visit (INDEPENDENT_AMBULATORY_CARE_PROVIDER_SITE_OTHER): Payer: Self-pay | Admitting: Family Medicine

## 2023-03-18 ENCOUNTER — Other Ambulatory Visit: Payer: Self-pay

## 2023-03-18 ENCOUNTER — Ambulatory Visit
Admission: RE | Admit: 2023-03-18 | Discharge: 2023-03-18 | Disposition: A | Discharge status: Home / Self Care | Payer: Self-pay | Source: Ambulatory Visit | Attending: Family Medicine | Admitting: Family Medicine

## 2023-03-18 ENCOUNTER — Encounter: Payer: Self-pay | Admitting: Family Medicine

## 2023-03-18 VITALS — BP 144/98 | HR 68 | Ht 69.0 in | Wt 175.0 lb

## 2023-03-18 DIAGNOSIS — M1611 Unilateral primary osteoarthritis, right hip: Secondary | ICD-10-CM

## 2023-03-18 DIAGNOSIS — G8929 Other chronic pain: Secondary | ICD-10-CM | POA: Insufficient documentation

## 2023-03-18 DIAGNOSIS — M545 Low back pain, unspecified: Secondary | ICD-10-CM | POA: Insufficient documentation

## 2023-03-18 DIAGNOSIS — M4727 Other spondylosis with radiculopathy, lumbosacral region: Secondary | ICD-10-CM

## 2023-03-18 MED ORDER — METHOCARBAMOL 500 MG PO TABS
500.0000 mg | ORAL_TABLET | Freq: Three times a day (TID) | ORAL | 0 refills | Status: DC | PRN
Start: 2023-03-18 — End: 2024-05-25

## 2023-03-18 MED ORDER — GABAPENTIN 100 MG PO CAPS
100.0000 mg | ORAL_CAPSULE | Freq: Every day | ORAL | 0 refills | Status: DC
Start: 2023-03-18 — End: 2024-03-23

## 2023-03-18 MED ORDER — TRAMADOL HCL 50 MG PO TABS
50.0000 mg | ORAL_TABLET | Freq: Three times a day (TID) | ORAL | 0 refills | Status: AC | PRN
Start: 2023-03-18 — End: 2023-03-23

## 2023-03-18 MED ORDER — PREDNISONE 50 MG PO TABS
50.0000 mg | ORAL_TABLET | Freq: Every day | ORAL | 0 refills | Status: DC
Start: 2023-03-18 — End: 2024-03-23

## 2023-03-18 NOTE — Assessment & Plan Note (Signed)
Acute on chronic with exacerbation x 1 week, preceding this he does endorse some new workout exercises involving lumbosacral hyperextension. Noted right paresthesias, right groin pain, and pain with ADLs. Has been taking Aleve 3 tabs daily with some improvement.  Examination with +SLR, +FABER, +FADIR, decreased lumbosacral and right hip ROM, sensorimotor otherwise intact in lower extremities.  Findings consistent with primarily right-sided lumbosacral spondylosis related radiculopathy and secondary/compensatory right hip arthralgia in the setting of OA. Plan as follows: - Start prednisone - Dose nightly gabapentin - PRN methocarbamol - PRN tramadol - After prednisone completed, can dose naproxen 2 tabs, BID PRN

## 2023-03-18 NOTE — Assessment & Plan Note (Signed)
See additional assessment(s) for plan details. 

## 2023-03-18 NOTE — Patient Instructions (Signed)
-   Start prednisone - Dose nightly gabapentin - As-needed methocarbamol - As-needed tramadol - After prednisone completed, can dose naproxen 2 tabs, twice daily as-needed - Return as scheduled

## 2023-03-18 NOTE — Progress Notes (Signed)
Primary Care / Sports Medicine Office Visit  Patient Information:  Patient ID: Adrian Lee, male DOB: 1971-07-17 Age: 52 y.o. MRN: [id number]   Adrian Lee is a pleasant 52 y.o. male presenting with the following:  Chief Complaint  Patient presents with   Back Pain    Down right side to leg, standing is worst, for 1 week, has been taking 3 aleve daily, no relief    Vitals:   03/18/23 1341 03/18/23 1343  BP: (!) 150/100 (!) 144/98  Pulse: 68   SpO2: 97%    Vitals:   03/18/23 1341  Weight: 175 lb (79.4 kg)  Height: 5\' 9"  (1.753 m)   Body mass index is 25.84 kg/m.  No results found.   Independent interpretation of notes and tests performed by another provider:   Independent interpretation of the lumbar spine x-rays dated 03/18/2023 demonstrates severe intervertebral narrowing at L4-5 and L5-S1, an element of retrolisthesis of L4 on L5, prominent bridging osteophyte between L4-5 and anterior base osteophyte at L5-S1, associated facet hypertrophy  Independent interpretation of right hip x-ray demonstrates moderate degenerative changes at the femoral head, cam deformity noted, no acute osseous processes identified  Procedures performed:   None  Pertinent History, Exam, Impression, and Recommendations:   Adrian Lee was seen today for back pain.  Lumbosacral spondylosis with radiculopathy Assessment & Plan: Acute on chronic with exacerbation x 1 week, preceding this he does endorse some new workout exercises involving lumbosacral hyperextension. Noted right paresthesias, right groin pain, and pain with ADLs. Has been taking Aleve 3 tabs daily with some improvement.  Examination with +SLR, +FABER, +FADIR, decreased lumbosacral and right hip ROM, sensorimotor otherwise intact in lower extremities.  Findings consistent with primarily right-sided lumbosacral spondylosis related radiculopathy and secondary/compensatory right hip arthralgia in the setting of OA. Plan as follows: -  Start prednisone - Dose nightly gabapentin - PRN methocarbamol - PRN tramadol - After prednisone completed, can dose naproxen 2 tabs, BID PRN  Orders: -     predniSONE; Take 1 tablet (50 mg total) by mouth daily.  Dispense: 5 tablet; Refill: 0 -     Methocarbamol; Take 1 tablet (500 mg total) by mouth every 8 (eight) hours as needed for muscle spasms.  Dispense: 30 tablet; Refill: 0 -     traMADol HCl; Take 1 tablet (50 mg total) by mouth every 8 (eight) hours as needed for up to 5 days.  Dispense: 15 tablet; Refill: 0 -     Gabapentin; Take 1 capsule (100 mg total) by mouth at bedtime.  Dispense: 20 capsule; Refill: 0 -     Ambulatory referral to Physical Therapy  Primary osteoarthritis of right hip Assessment & Plan: See additional assessment(s) for plan details.  Orders: -     Ambulatory referral to Physical Therapy     Orders & Medications Meds ordered this encounter  Medications   predniSONE (DELTASONE) 50 MG tablet    Sig: Take 1 tablet (50 mg total) by mouth daily.    Dispense:  5 tablet    Refill:  0   methocarbamol (ROBAXIN) 500 MG tablet    Sig: Take 1 tablet (500 mg total) by mouth every 8 (eight) hours as needed for muscle spasms.    Dispense:  30 tablet    Refill:  0   traMADol (ULTRAM) 50 MG tablet    Sig: Take 1 tablet (50 mg total) by mouth every 8 (eight) hours as needed for up to 5 days.  Dispense:  15 tablet    Refill:  0   gabapentin (NEURONTIN) 100 MG capsule    Sig: Take 1 capsule (100 mg total) by mouth at bedtime.    Dispense:  20 capsule    Refill:  0   Orders Placed This Encounter  Procedures   Ambulatory referral to Physical Therapy     No follow-ups on file.     Montel Culver, MD, [facility]   Primary Care Sports Medicine Primary Care and Sports Medicine at [facility]

## 2023-03-21 ENCOUNTER — Encounter: Payer: Self-pay | Admitting: Family Medicine

## 2023-03-21 ENCOUNTER — Ambulatory Visit (INDEPENDENT_AMBULATORY_CARE_PROVIDER_SITE_OTHER): Payer: Self-pay | Admitting: Family Medicine

## 2023-03-21 VITALS — BP 120/82 | HR 78 | Ht 69.0 in | Wt 171.0 lb

## 2023-03-21 DIAGNOSIS — Z13220 Encounter for screening for lipoid disorders: Secondary | ICD-10-CM

## 2023-03-21 DIAGNOSIS — Z0000 Encounter for general adult medical examination without abnormal findings: Secondary | ICD-10-CM

## 2023-03-21 DIAGNOSIS — Z125 Encounter for screening for malignant neoplasm of prostate: Secondary | ICD-10-CM

## 2023-03-21 DIAGNOSIS — R0602 Shortness of breath: Secondary | ICD-10-CM

## 2023-03-21 DIAGNOSIS — M4727 Other spondylosis with radiculopathy, lumbosacral region: Secondary | ICD-10-CM

## 2023-03-21 MED ORDER — ALBUTEROL SULFATE HFA 108 (90 BASE) MCG/ACT IN AERS
2.0000 | INHALATION_SPRAY | Freq: Four times a day (QID) | RESPIRATORY_TRACT | 2 refills | Status: DC | PRN
Start: 2023-03-21 — End: 2024-03-23

## 2023-03-21 NOTE — Assessment & Plan Note (Signed)
Improved following recent pharmacotherapy, advised to finish out course and contact us for any persistent symptomatology.

## 2023-03-21 NOTE — Assessment & Plan Note (Signed)
Annual examination completed, risk stratification labs ordered, anticipatory guidance provided.  We will follow labs once resulted. 

## 2023-03-21 NOTE — Patient Instructions (Addendum)
-   Obtain fasting labs with orders provided (can have water or black coffee but otherwise no food or drink x 8 hours before labs) - Review information provided - Attend eye doctor annually, dentist every 6 months, work towards or maintain 30 minutes of moderate intensity physical activity at least 5 days per week, and consume a balanced diet - Return in 1 year for physical - Contact us for any questions between now and then  Additionally: - Dose albuterol on an as needed basis for shortness of breath - If ineffective, contact our office for next steps - If working well, you may benefit from a daily medication to keep breathing symptoms controlled - Stop by health department to get the following vaccines: - Tetanus - Shingles

## 2023-03-21 NOTE — Assessment & Plan Note (Signed)
Chronic condition with dyspnea on exertion, shortness of breath related.  O2 sats reviewed with patient and exam findings are reassuring.  Cardiac versus pulmonary etiology is raised with patient.  -Trial as needed albuterol to assess benefit, if beneficial can consider maintenance medications - If ineffective, cardiac workup to be coordinated

## 2023-03-21 NOTE — Progress Notes (Signed)
Annual Physical Exam Visit  Patient Information:  Patient ID: Adrian Lee, male DOB: [birth year] Age: 52 y.o. MRN: [id number]   Subjective:   CC: Annual Physical Exam  HPI:  Adrian Lee is here for their annual physical.  I reviewed the past medical history, family history, social history, surgical history, and allergies today and changes were made as necessary.  Please see the problem list section below for additional details.  Past Medical History: Past Medical History:  Diagnosis Date   Flatulence 12/27/2021   GERD (gastroesophageal reflux disease)    Hyperlipidemia    Past Surgical History: Past Surgical History:  Procedure Laterality Date   COLONOSCOPY WITH PROPOFOL N/A 09/25/2022   Procedure: COLONOSCOPY WITH PROPOFOL;  Surgeon: Jonathon Bellows, MD;  Location: [facility] ENDOSCOPY;  Service: Gastroenterology;  Laterality: N/A;   DENTAL SURGERY     ROOT CANAL     WISDOM TOOTH EXTRACTION Bilateral    Family History: Family History  Problem Relation Age of Onset   Diabetes Mother    Allergies: No Known Allergies Health Maintenance: Health Maintenance  Topic Date Due   HIV Screening  Never done   Hepatitis C Screening  Never done   DTaP/Tdap/Td (1 - Tdap) Never done   Zoster Vaccines- Shingrix (1 of 2) Never done   COVID-19 Vaccine (2 - 2023-24 season) 08/17/2022   INFLUENZA VACCINE  07/18/2023   COLONOSCOPY (Pts 45-69yrs Insurance coverage will need to be confirmed)  09/25/2032   HPV VACCINES  Aged Out    HM Colonoscopy          COLONOSCOPY (Pts 45-63yrs Insurance coverage will need to be confirmed) (Every 10 Years) Next due on 09/25/2032    09/25/2022  Surgical Procedure: COLONOSCOPY WITH PROPOFOL   Only the first 1 history entries have been loaded, but more history exists.           Medications: Current Outpatient Medications on File Prior to Visit  Medication Sig Dispense Refill   anastrozole (ARIMIDEX) 1 MG tablet Take 1 mg by mouth once a week.      gabapentin (NEURONTIN) 100 MG capsule Take 1 capsule (100 mg total) by mouth at bedtime. 20 capsule 0   hydrOXYzine (VISTARIL) 25 MG capsule Take 1 capsule (25 mg total) by mouth every 8 (eight) hours as needed for anxiety. 30 capsule 0   methocarbamol (ROBAXIN) 500 MG tablet Take 1 tablet (500 mg total) by mouth every 8 (eight) hours as needed for muscle spasms. 30 tablet 0   mometasone (NASONEX) 50 MCG/ACT nasal spray One spray in each nostril twice a day, use left hand for right nostril, and right hand for left nostril.  Please dispense one bottle. 1 g 6   predniSONE (DELTASONE) 50 MG tablet Take 1 tablet (50 mg total) by mouth daily. 5 tablet 0   TESTOSTERONE IM Inject into the muscle.     traMADol (ULTRAM) 50 MG tablet Take 1 tablet (50 mg total) by mouth every 8 (eight) hours as needed for up to 5 days. 15 tablet 0   No current facility-administered medications on file prior to visit.    Review of Systems: No headache, visual changes, nausea, vomiting, diarrhea, constipation, dizziness, abdominal pain, skin rash, fevers, chills, night sweats, swollen lymph nodes, weight loss, chest pain, body aches, joint swelling, muscle aches, shortness of breath, mood changes, visual or auditory hallucinations reported.  Objective:   Vitals:   03/21/23 0807 03/21/23 0811  BP: (!) 128/100 120/82  Pulse:  78   SpO2: 98%    Vitals:   03/21/23 0807  Weight: 171 lb (77.6 kg)  Height: 5\' 9"  (1.753 m)   Body mass index is 25.25 kg/m.  General: Well Developed, well nourished, and in no acute distress.  Neuro: Alert and oriented x3, extra-ocular muscles intact, sensation grossly intact. Cranial nerves II through XII are grossly intact, motor, sensory, and coordinative functions are intact. HEENT: Normocephalic, atraumatic, pupils equal round reactive to light, neck supple, no masses, no lymphadenopathy, thyroid nonpalpable. Oropharynx, nasopharynx, external ear canals are unremarkable. Skin: Warm and  dry, no rashes noted.  Cardiac: Regular rate and rhythm, no murmurs rubs or gallops. No peripheral edema. Pulses symmetric. Respiratory: Clear to auscultation bilaterally. Not using accessory muscles, speaking in full sentences.  Abdominal: Soft, nontender, nondistended, positive bowel sounds, no masses, no organomegaly. Musculoskeletal: Shoulder, elbow, wrist, hip, knee, ankle stable, and with full range of motion.   Impression and Recommendations:   The patient was counselled, risk factors were discussed, and anticipatory guidance given.  Problem List Items Addressed This Visit       Nervous and Auditory   Lumbosacral spondylosis with radiculopathy    Improved following recent pharmacotherapy, advised to finish out course and contact us for any persistent symptomatology.        Other   Annual physical exam - Primary    Annual examination completed, risk stratification labs ordered, anticipatory guidance provided.  We will follow labs once resulted.      Relevant Orders   CBC   Lipid panel   TSH   PSA Total (Reflex To Free)   Basic metabolic panel   Prostate cancer screening   Relevant Orders   PSA Total (Reflex To Free)   Shortness of breath    Chronic condition with dyspnea on exertion, shortness of breath related.  O2 sats reviewed with patient and exam findings are reassuring.  Cardiac versus pulmonary etiology is raised with patient.  -Trial as needed albuterol to assess benefit, if beneficial can consider maintenance medications - If ineffective, cardiac workup to be coordinated      Relevant Medications   albuterol (VENTOLIN HFA) 108 (90 Base) MCG/ACT inhaler   Other Visit Diagnoses     Screening for lipoid disorders       Relevant Orders   Lipid panel   Basic metabolic panel        Orders & Medications Medications:  Meds ordered this encounter  Medications   albuterol (VENTOLIN HFA) 108 (90 Base) MCG/ACT inhaler    Sig: Inhale 2 puffs into the lungs  every 6 (six) hours as needed for wheezing or shortness of breath.    Dispense:  8 g    Refill:  2   Orders Placed This Encounter  Procedures   CBC   Lipid panel   TSH   PSA Total (Reflex To Free)   Basic metabolic panel     Return in about 1 year (around 03/20/2024) for CPE.    Montel Culver, MD, [facility]   Primary Care Sports Medicine Primary Care and Sports Medicine at [facility]

## 2023-03-22 LAB — BASIC METABOLIC PANEL
BUN/Creatinine Ratio: 14 (ref 9–20)
BUN: 17 mg/dL (ref 6–24)
CO2: 25 mmol/L (ref 20–29)
Calcium: 8.5 mg/dL — ABNORMAL LOW (ref 8.7–10.2)
Chloride: 98 mmol/L (ref 96–106)
Creatinine, Ser: 1.18 mg/dL (ref 0.76–1.27)
Glucose: 84 mg/dL (ref 70–99)
Potassium: 4.1 mmol/L (ref 3.5–5.2)
Sodium: 137 mmol/L (ref 134–144)
eGFR: 75 mL/min/{1.73_m2} (ref >59)

## 2023-03-22 LAB — PSA TOTAL (REFLEX TO FREE): Prostate Specific Ag, Serum: 0.3 ng/mL (ref 0.0–4.0)

## 2023-03-22 LAB — CBC
Hematocrit: 49 % (ref 37.5–51.0)
Hemoglobin: 16.5 g/dL (ref 13.0–17.7)
MCH: 30.8 pg (ref 26.6–33.0)
MCHC: 33.7 g/dL (ref 31.5–35.7)
MCV: 91 fL (ref 79–97)
Platelets: 275 10*3/uL (ref 150–450)
RBC: 5.36 x10E6/uL (ref 4.14–5.80)
RDW: 13.1 % (ref 11.6–15.4)
WBC: 12.6 10*3/uL — ABNORMAL HIGH (ref 3.4–10.8)

## 2023-03-22 LAB — LIPID PANEL
Chol/HDL Ratio: 4.1 ratio (ref 0.0–5.0)
Cholesterol, Total: 233 mg/dL — ABNORMAL HIGH (ref 100–199)
HDL: 57 mg/dL (ref >39)
LDL Chol Calc (NIH): 145 mg/dL — ABNORMAL HIGH (ref 0–99)
Triglycerides: 172 mg/dL — ABNORMAL HIGH (ref 0–149)
VLDL Cholesterol Cal: 31 mg/dL (ref 5–40)

## 2023-03-22 LAB — TSH: TSH: 2.33 u[IU]/mL (ref 0.450–4.500)

## 2023-04-19 ENCOUNTER — Other Ambulatory Visit: Payer: Self-pay

## 2023-04-19 DIAGNOSIS — D72829 Elevated white blood cell count, unspecified: Secondary | ICD-10-CM

## 2023-04-19 NOTE — Progress Notes (Signed)
Called pt left VM for him to get his repeat labs done.  KP

## 2023-05-07 LAB — CBC WITH DIFFERENTIAL/PLATELET
Basophils Absolute: 0.1 10*3/uL (ref 0.0–0.2)
Basos: 1 %
EOS (ABSOLUTE): 0.2 10*3/uL (ref 0.0–0.4)
Eos: 3 %
Hematocrit: 49.7 % (ref 37.5–51.0)
Hemoglobin: 16.7 g/dL (ref 13.0–17.7)
Immature Grans (Abs): 0.1 10*3/uL (ref 0.0–0.1)
Immature Granulocytes: 1 %
Lymphocytes Absolute: 1.5 10*3/uL (ref 0.7–3.1)
Lymphs: 22 %
MCH: 30.7 pg (ref 26.6–33.0)
MCHC: 33.6 g/dL (ref 31.5–35.7)
MCV: 91 fL (ref 79–97)
Monocytes Absolute: 0.5 10*3/uL (ref 0.1–0.9)
Monocytes: 7 %
Neutrophils Absolute: 4.7 10*3/uL (ref 1.4–7.0)
Neutrophils: 66 %
Platelets: 226 10*3/uL (ref 150–450)
RBC: 5.44 x10E6/uL (ref 4.14–5.80)
RDW: 11.9 % (ref 11.6–15.4)
WBC: 7.1 10*3/uL (ref 3.4–10.8)

## 2024-03-23 ENCOUNTER — Ambulatory Visit (INDEPENDENT_AMBULATORY_CARE_PROVIDER_SITE_OTHER): Payer: Self-pay | Admitting: Family Medicine

## 2024-03-23 ENCOUNTER — Encounter: Payer: Self-pay | Admitting: Family Medicine

## 2024-03-23 VITALS — BP 110/86 | HR 78 | Ht 69.0 in | Wt 172.4 lb

## 2024-03-23 DIAGNOSIS — F419 Anxiety disorder, unspecified: Secondary | ICD-10-CM

## 2024-03-23 DIAGNOSIS — Z0000 Encounter for general adult medical examination without abnormal findings: Secondary | ICD-10-CM

## 2024-03-23 DIAGNOSIS — J309 Allergic rhinitis, unspecified: Secondary | ICD-10-CM

## 2024-03-23 DIAGNOSIS — Z1159 Encounter for screening for other viral diseases: Secondary | ICD-10-CM

## 2024-03-23 DIAGNOSIS — N400 Enlarged prostate without lower urinary tract symptoms: Secondary | ICD-10-CM

## 2024-03-23 DIAGNOSIS — R7309 Other abnormal glucose: Secondary | ICD-10-CM

## 2024-03-23 DIAGNOSIS — Z23 Encounter for immunization: Secondary | ICD-10-CM

## 2024-03-23 DIAGNOSIS — E785 Hyperlipidemia, unspecified: Secondary | ICD-10-CM

## 2024-03-23 DIAGNOSIS — R0602 Shortness of breath: Secondary | ICD-10-CM

## 2024-03-23 DIAGNOSIS — R5382 Chronic fatigue, unspecified: Secondary | ICD-10-CM

## 2024-03-23 MED ORDER — SERTRALINE HCL 50 MG PO TABS: 50.0000 mg | ORAL_TABLET | Freq: Every day | ORAL | 0 refills | Status: DC

## 2024-03-23 MED ORDER — ALBUTEROL SULFATE HFA 108 (90 BASE) MCG/ACT IN AERS: 2.0000 | INHALATION_SPRAY | Freq: Four times a day (QID) | RESPIRATORY_TRACT | 2 refills | Status: AC | PRN

## 2024-03-23 MED ORDER — MOMETASONE FUROATE 50 MCG/ACT NA SUSP: NASAL | 6 refills | Status: AC

## 2024-03-23 MED ORDER — TRAZODONE HCL 50 MG PO TABS: 25.0000 mg | ORAL_TABLET | Freq: Every evening | ORAL | 1 refills | Status: DC | PRN

## 2024-03-23 MED ORDER — HYDROXYZINE HCL 25 MG PO TABS: 25.0000 mg | ORAL_TABLET | Freq: Four times a day (QID) | ORAL | 2 refills | Status: DC | PRN

## 2024-03-23 NOTE — Assessment & Plan Note (Signed)
 Significant nasal congestion is attributed to allergies. He has used nasal sprays in the past but stopped due to concerns about side effects. Currently, he uses a magnetic device to open his nostrils, providing some relief but not fully alleviating symptoms. Anxiety related to breathing difficulties, especially at night, causes frequent awakenings and a fear of choking if breathing through the mouth due to nasal congestion.  Physical Exam VITALS: SaO2- 97% HEENT: Nasal mucosa swollen on the right side more than the left. Ears normal. Oral cavity normal. NECK: No cervical lymphadenopathy. No thyromegaly. CHEST: Lungs clear to auscultation bilaterally.  Allergic Rhinitis Symptoms likely due to allergies, exacerbated by pollen and dust. Previous nasal sprays ineffective. Discussed steroid nasal sprays and antihistamines. Emphasized daily antihistamine use and utility of ENT evaluation. - Prescribe steroid nasal spray for nasal congestion.  If unable to obtain the prescription, use over-the-counter Flonase Sensimist or alternative steroid nose spray without alcohol propellant. - Advise daily use of over-the-counter antihistamine (Zyrtec). - Refer to ENT for further evaluation and management of nasal congestion and potential sinus issues.

## 2024-03-23 NOTE — Assessment & Plan Note (Signed)
 He experiences breathing difficulties characterized by a sensation of pressure in the lower chest when attempting to inhale, preventing deep breaths. Previously, an albuterol inhaler was effective, but a recent over-the-counter inhaler worsened symptoms. He describes a feeling of limited lung expansion, akin to having eaten a large meal.  Dyspnea Dyspnea and chest tightness possibly asthma-related. Albuterol inhaler previously effective. Symptoms exacerbated by allergies and environmental factors. Discussed rescue inhaler use and potential maintenance therapy. Informed about increased heart rate as a side effect. - Prescribe albuterol inhaler as needed for acute symptoms. - Keep in mind side effect of high heart rate. - Advise monitoring frequency of inhaler use and report if frequent use is needed so that we can consider a maintenance medication.

## 2024-03-23 NOTE — Assessment & Plan Note (Signed)
 He has a history of anxiety, previously managed with medication. Symptoms include feeling on edge, insomnia, and palpitations. He has visited the emergency room for anxiety and found Xanax effective. Hydroxyzine was partially effective but insufficient. He desires medication to manage anxiety, particularly for sleep.  Anxiety Anxiety with symptoms of feeling on edge, insomnia, and palpitations. Exacerbated by stress and environmental factors. Previous hydroxyzine somewhat efficacious. Discussed SSRIs for long-term management. Suggested trazodone for sleep issues. Xanax effective in past but not recommended long-term. - Prescribe daily SSRI for anxiety management. - Prescribe trazodone as needed for sleep, with dosing instructions to find the lowest effective dose that allows for falling asleep, staying asleep, and awakening without grogginess.  Can dose 25-100 mg (1/2 tablet to up to 2 tablets) nightly on an as-needed basis.

## 2024-03-23 NOTE — Patient Instructions (Addendum)
-   Obtain fasting labs with orders provided (can have water or black coffee but otherwise no food or drink x 8 hours before labs) - Review information provided - Attend eye doctor annually, dentist every 6 months, work towards or maintain 30 minutes of moderate intensity physical activity at least 5 days per week, and consume a balanced diet - Return in 1 year for physical - Contact us for any questions between now and then   Patient Plan for Post-Visit Guidance  1. Allergic Rhinitis:    - Use the prescribed steroid nasal spray for nasal congestion. If unavailable, use over-the-counter Flonase Sensimist or a similar steroid nasal spray.    - Take an over-the-counter antihistamine like Zyrtec daily.    - Schedule a visit with an ENT specialist for further evaluation.  2. Anxiety:    - Start taking the prescribed SSRI daily (sertraline) for anxiety management.    - Use trazodone as needed for sleep. Adjust the dose between 25-100 mg to find the lowest effective dose that helps you sleep without morning grogginess.  3. Chronic Fatigue:    - Complete the ordered blood tests (CBC and TSH) to evaluate possible causes of fatigue.  4. Hyperlipidemia:    - Complete the comprehensive metabolic panel and lipid panel tests as ordered.  5. Shortness of Breath:    - Use the albuterol inhaler as needed for acute breathing difficulties. Be aware of the potential side effect of an increased heart rate.    - Monitor how often you use the inhaler and report frequent use so we can discuss maintenance therapy options.  6. Critical Symptoms to Watch For:    - If you experience worsening breathing difficulties, severe anxiety, or significant side effects from medications, contact your healthcare provider immediately.  Please follow these steps and reach out if you have any questions or concerns.

## 2024-03-23 NOTE — Progress Notes (Signed)
 Annual Physical Exam Visit  Patient Information:  Patient ID: Adrian Lee, male DOB: [birth year] Age: 53 y.o. MRN: [id number]   Subjective:   CC: Annual Physical Exam  HPI:  Adrian Lee is here for their annual physical.  I reviewed the past medical history, family history, social history, surgical history, and allergies today and changes were made as necessary.  Please see the problem list section below for additional details.  Past Medical History: Past Medical History:  Diagnosis Date   Flatulence 12/27/2021   GERD (gastroesophageal reflux disease)    Hyperlipidemia    Past Surgical History: Past Surgical History:  Procedure Laterality Date   COLONOSCOPY WITH PROPOFOL N/A 09/25/2022   Procedure: COLONOSCOPY WITH PROPOFOL;  Surgeon: [name], MD;  Location: [facility] ENDOSCOPY;  Service: Gastroenterology;  Laterality: N/A;   DENTAL SURGERY     ROOT CANAL     WISDOM TOOTH EXTRACTION Bilateral    Family History: Family History  Problem Relation Age of Onset   Diabetes Mother    Allergies: No Known Allergies Health Maintenance: Health Maintenance  Topic Date Due   HIV Screening  Never done   Hepatitis C Screening  Never done   COVID-19 Vaccine (2 - 2024-25 season) 04/08/2024 (Originally 08/18/2023)   Zoster Vaccines- Shingrix (2 of 2) 05/18/2024   INFLUENZA VACCINE  07/17/2024   Colonoscopy  09/25/2032   DTaP/Tdap/Td (2 - Td or Tdap) 03/23/2034   HPV VACCINES  Aged Out    HM Colonoscopy          Upcoming     Colonoscopy (Every 10 Years) Next due on 09/25/2032    09/25/2022  Surgical Procedure: COLONOSCOPY WITH PROPOFOL   Only the first 1 history entries have been loaded, but more history exists.               Medications: Current Outpatient Medications on File Prior to Visit  Medication Sig Dispense Refill   TESTOSTERONE IM Inject into the muscle.     anastrozole (ARIMIDEX) 1 MG tablet Take 1 mg by mouth once a week. (Patient not taking: Reported on  03/23/2024)     methocarbamol (ROBAXIN) 500 MG tablet Take 1 tablet (500 mg total) by mouth every 8 (eight) hours as needed for muscle spasms. (Patient not taking: Reported on 03/23/2024) 30 tablet 0   No current facility-administered medications on file prior to visit.    Objective:   Vitals:   03/23/24 0800  BP: 110/86  Pulse: 78  SpO2: 97%   Vitals:   03/23/24 0800  Weight: 172 lb 6.4 oz (78.2 kg)  Height: 5\' 9"  (1.753 m)   Body mass index is 25.46 kg/m.  General: Well Developed, well nourished, and in no acute distress.  Neuro: Alert and oriented x3, extra-ocular muscles intact, sensation grossly intact. Cranial nerves II through XII are grossly intact, motor, sensory, and coordinative functions are intact. HEENT: Normocephalic, atraumatic, neck supple, no masses, no lymphadenopathy, thyroid nonenlarged. Oropharynx, nasopharynx, external ear canals are unremarkable. Skin: Warm and dry, no rashes noted.  Cardiac: Regular rate and rhythm, no murmurs rubs or gallops. No peripheral edema. Pulses symmetric. Respiratory: Clear to auscultation bilaterally. Speaking in full sentences.  Abdominal: Soft, nontender, nondistended, positive bowel sounds, no masses, no organomegaly. Musculoskeletal: Stable, and with full range of motion.  Impression and Recommendations:   The patient was counselled, risk factors were discussed, and anticipatory guidance given.  Problem List Items Addressed This Visit     Allergic rhinitis (Chronic)  Significant nasal congestion is attributed to allergies. He has used nasal sprays in the past but stopped due to concerns about side effects. Currently, he uses a magnetic device to open his nostrils, providing some relief but not fully alleviating symptoms. Anxiety related to breathing difficulties, especially at night, causes frequent awakenings and a fear of choking if breathing through the mouth due to nasal congestion.  Physical Exam VITALS: SaO2-  97% HEENT: Nasal mucosa swollen on the right side more than the left. Ears normal. Oral cavity normal. NECK: No cervical lymphadenopathy. No thyromegaly. CHEST: Lungs clear to auscultation bilaterally.  Allergic Rhinitis Symptoms likely due to allergies, exacerbated by pollen and dust. Previous nasal sprays ineffective. Discussed steroid nasal sprays and antihistamines. Emphasized daily antihistamine use and utility of ENT evaluation. - Prescribe steroid nasal spray for nasal congestion.  If unable to obtain the prescription, use over-the-counter Flonase Sensimist or alternative steroid nose spray without alcohol propellant. - Advise daily use of over-the-counter antihistamine (Zyrtec). - Refer to ENT for further evaluation and management of nasal congestion and potential sinus issues.     Relevant Medications   mometasone (NASONEX) 50 MCG/ACT nasal spray   Other Relevant Orders   Ambulatory referral to ENT   Anxiety   He has a history of anxiety, previously managed with medication. Symptoms include feeling on edge, insomnia, and palpitations. He has visited the emergency room for anxiety and found Xanax effective. Hydroxyzine was partially effective but insufficient. He desires medication to manage anxiety, particularly for sleep.  Anxiety Anxiety with symptoms of feeling on edge, insomnia, and palpitations. Exacerbated by stress and environmental factors. Previous hydroxyzine somewhat efficacious. Discussed SSRIs for long-term management. Suggested trazodone for sleep issues. Xanax effective in past but not recommended long-term. - Prescribe daily SSRI for anxiety management. - Prescribe trazodone as needed for sleep, with dosing instructions to find the lowest effective dose that allows for falling asleep, staying asleep, and awakening without grogginess.  Can dose 25-100 mg (1/2 tablet to up to 2 tablets) nightly on an as-needed basis.       Relevant Medications   hydrOXYzine (ATARAX)  25 MG tablet   sertraline (ZOLOFT) 50 MG tablet   traZODone (DESYREL) 50 MG tablet   Chronic fatigue   Relevant Orders   CBC   TSH   Hyperlipidemia   Relevant Orders   Comprehensive metabolic panel with GFR   Lipid panel   Shortness of breath   He experiences breathing difficulties characterized by a sensation of pressure in the lower chest when attempting to inhale, preventing deep breaths. Previously, an albuterol inhaler was effective, but a recent over-the-counter inhaler worsened symptoms. He describes a feeling of limited lung expansion, akin to having eaten a large meal.  Dyspnea Dyspnea and chest tightness possibly asthma-related. Albuterol inhaler previously effective. Symptoms exacerbated by allergies and environmental factors. Discussed rescue inhaler use and potential maintenance therapy. Informed about increased heart rate as a side effect. - Prescribe albuterol inhaler as needed for acute symptoms. - Keep in mind side effect of high heart rate. - Advise monitoring frequency of inhaler use and report if frequent use is needed so that we can consider a maintenance medication.      Relevant Medications   albuterol (VENTOLIN HFA) 108 (90 Base) MCG/ACT inhaler   Other Visit Diagnoses       Healthcare maintenance    -  Primary     Benign prostatic hyperplasia, unspecified whether lower urinary tract symptoms present  Relevant Orders   PSA Total (Reflex To Free)     Abnormal glucose       Relevant Orders   Hemoglobin A1c     Screening for viral disease       Relevant Orders   Hepatitis C antibody   HIV Antibody (routine testing w rflx)     Immunization due       Relevant Orders   Varicella-zoster vaccine IM (Completed)   Tdap vaccine greater than or equal to 7yo IM (Completed)        Orders & Medications Medications:  Meds ordered this encounter  Medications   hydrOXYzine (ATARAX) 25 MG tablet    Sig: Take 1 tablet (25 mg total) by mouth 4 (four) times  daily as needed for anxiety.    Dispense:  30 tablet    Refill:  2   sertraline (ZOLOFT) 50 MG tablet    Sig: Take 1 tablet (50 mg total) by mouth daily.    Dispense:  90 tablet    Refill:  0   mometasone (NASONEX) 50 MCG/ACT nasal spray    Sig: One spray in each nostril twice a day, use left hand for right nostril, and right hand for left nostril.  Please dispense one bottle.    Dispense:  1 g    Refill:  6   albuterol (VENTOLIN HFA) 108 (90 Base) MCG/ACT inhaler    Sig: Inhale 2 puffs into the lungs every 6 (six) hours as needed for wheezing or shortness of breath.    Dispense:  8 g    Refill:  2   traZODone (DESYREL) 50 MG tablet    Sig: Take 0.5-2 tablets (25-100 mg total) by mouth at bedtime as needed.    Dispense:  60 tablet    Refill:  1   Orders Placed This Encounter  Procedures   Varicella-zoster vaccine IM   Tdap vaccine greater than or equal to 7yo IM   CBC   Comprehensive metabolic panel with GFR   Hemoglobin A1c   Hepatitis C antibody   HIV Antibody (routine testing w rflx)   Lipid panel   PSA Total (Reflex To Free)   TSH   Ambulatory referral to ENT     Return in about 2 months (around 05/23/2024) for video visit meds.    Jerrol Banana, MD, [facility]   Primary Care Sports Medicine Primary Care and Sports Medicine at [facility]

## 2024-03-24 ENCOUNTER — Other Ambulatory Visit: Payer: Self-pay | Admitting: Family Medicine

## 2024-03-24 DIAGNOSIS — F418 Other specified anxiety disorders: Secondary | ICD-10-CM

## 2024-03-24 DIAGNOSIS — J309 Allergic rhinitis, unspecified: Secondary | ICD-10-CM

## 2024-03-24 DIAGNOSIS — F419 Anxiety disorder, unspecified: Secondary | ICD-10-CM

## 2024-03-24 DIAGNOSIS — R0602 Shortness of breath: Secondary | ICD-10-CM

## 2024-03-24 DIAGNOSIS — E785 Hyperlipidemia, unspecified: Secondary | ICD-10-CM

## 2024-03-24 LAB — CBC
Hematocrit: 51.9 % — ABNORMAL HIGH (ref 37.5–51.0)
Hemoglobin: 17.5 g/dL (ref 13.0–17.7)
MCH: 30.9 pg (ref 26.6–33.0)
MCHC: 33.7 g/dL (ref 31.5–35.7)
MCV: 92 fL (ref 79–97)
Platelets: 249 x10E3/uL (ref 150–450)
RBC: 5.66 x10E6/uL (ref 4.14–5.80)
RDW: 12.4 % (ref 11.6–15.4)
WBC: 7 x10E3/uL (ref 3.4–10.8)

## 2024-03-24 LAB — COMPREHENSIVE METABOLIC PANEL WITH GFR
ALT: 23 IU/L (ref 0–44)
AST: 16 IU/L (ref 0–40)
Albumin: 4.6 g/dL (ref 3.8–4.9)
Alkaline Phosphatase: 91 IU/L (ref 44–121)
BUN/Creatinine Ratio: 24 — ABNORMAL HIGH (ref 9–20)
BUN: 20 mg/dL (ref 6–24)
Bilirubin Total: 0.3 mg/dL (ref 0.0–1.2)
CO2: 24 mmol/L (ref 20–29)
Calcium: 9.2 mg/dL (ref 8.7–10.2)
Chloride: 102 mmol/L (ref 96–106)
Creatinine, Ser: 0.84 mg/dL (ref 0.76–1.27)
Globulin, Total: 2.2 g/dL (ref 1.5–4.5)
Glucose: 98 mg/dL (ref 70–99)
Potassium: 4.7 mmol/L (ref 3.5–5.2)
Sodium: 137 mmol/L (ref 134–144)
Total Protein: 6.8 g/dL (ref 6.0–8.5)
eGFR: 105 mL/min/{1.73_m2} (ref >59)

## 2024-03-24 LAB — LIPID PANEL
Chol/HDL Ratio: 5.3 ratio — ABNORMAL HIGH (ref 0.0–5.0)
Cholesterol, Total: 187 mg/dL (ref 100–199)
HDL: 35 mg/dL — ABNORMAL LOW (ref >39)
LDL Chol Calc (NIH): 128 mg/dL — ABNORMAL HIGH (ref 0–99)
Triglycerides: 131 mg/dL (ref 0–149)
VLDL Cholesterol Cal: 24 mg/dL (ref 5–40)

## 2024-03-24 LAB — HEPATITIS C ANTIBODY: Hep C Virus Ab: NONREACTIVE

## 2024-03-24 LAB — HEMOGLOBIN A1C
Est. average glucose Bld gHb Est-mCnc: 108 mg/dL
Hgb A1c MFr Bld: 5.4 % (ref 4.8–5.6)

## 2024-03-24 LAB — TSH: TSH: 1.94 u[IU]/mL (ref 0.450–4.500)

## 2024-03-24 LAB — PSA TOTAL (REFLEX TO FREE): Prostate Specific Ag, Serum: 0.5 ng/mL (ref 0.0–4.0)

## 2024-03-24 LAB — HIV ANTIBODY (ROUTINE TESTING W REFLEX): HIV Screen 4th Generation wRfx: NONREACTIVE

## 2024-03-26 ENCOUNTER — Telehealth: Payer: Self-pay

## 2024-03-26 NOTE — Progress Notes (Signed)
 Spoke with patient regarding lab results and instructions from PCP. Patient verbalized understanding.

## 2024-03-26 NOTE — Progress Notes (Signed)
 Complex Care Management Note  Care Guide Note 03/26/2024 Name: Adrian Lee MRN: [id number] DOB: [birth year]  [name] is a 53 y.o. year old male who sees Ashley Royalty, Ocie Bob, MD for primary care. I reached out to [employer] by phone today to offer complex care management services.  [name] was given information about Complex Care Management services today including:   The Complex Care Management services include support from the care team which includes your Nurse Care Manager, Clinical Social Worker, or Pharmacist.  The Complex Care Management team is here to help remove barriers to the health concerns and goals most important to you. Complex Care Management services are voluntary, and the patient may decline or stop services at any time by request to their care team member.   Complex Care Management Consent Status: Patient agreed to services and verbal consent obtained.   Follow up plan:  Telephone appointment with complex care management team member scheduled for:  04/02/2024  Encounter Outcome:  Patient Scheduled  Penne Lash , RMA       [facility] Guide  Direct Dial: [phone]  Website: [email or web address]

## 2024-04-02 ENCOUNTER — Telehealth: Payer: Self-pay

## 2024-04-10 ENCOUNTER — Telehealth: Payer: Self-pay

## 2024-04-10 NOTE — Progress Notes (Signed)
 Complex Care Management Care Guide Note  04/10/2024 Name: [name] MRN: [id number] DOB: 04-04-1971  Adrian Lee is a 53 y.o. year old male who is a primary care patient of Ma Saupe, MD and is actively engaged with the care management team. I reached out to Relda Carder by phone today to assist with re-scheduling  with the Licensed Clinical Child psychotherapist.  Follow up plan: Pt declines rescheduling   Lenton Rail , RMA     [facility]  [facility], [facility] Guide  Direct Dial: [phone]  Website: [email or web address]

## 2024-05-25 ENCOUNTER — Telehealth (INDEPENDENT_AMBULATORY_CARE_PROVIDER_SITE_OTHER): Payer: Self-pay | Admitting: Family Medicine

## 2024-05-25 ENCOUNTER — Encounter: Payer: Self-pay | Admitting: Family Medicine

## 2024-05-25 DIAGNOSIS — J309 Allergic rhinitis, unspecified: Secondary | ICD-10-CM

## 2024-05-25 DIAGNOSIS — J301 Allergic rhinitis due to pollen: Secondary | ICD-10-CM

## 2024-05-25 DIAGNOSIS — F419 Anxiety disorder, unspecified: Secondary | ICD-10-CM | POA: Diagnosis not present

## 2024-05-25 NOTE — Assessment & Plan Note (Signed)
 He has experienced significant improvement in his breathing issues, which previously caused nocturnal awakenings with a fear of choking and necessitated mouth breathing. This improvement is attributed to allergy management and the use of a nasal spray and inhaler. Despite this, he still experiences occasional nasal stuffiness and intermittent tonsillar swelling, leading to discomfort. He continues to use the steroid nasal spray, Nasonex  (mometasone ), daily.  Allergic rhinitis Intermittent nasal congestion improved with Nasonex . Continued use recommended until ENT evaluation. - Continue Nasonex  (mometasone ) nasal spray daily until ENT evaluation. - Referral to ENT for further evaluation of nasal and throat symptoms placed at last visit, phone number provided to patient today: [facility] [place], [place]  [phone]

## 2024-05-25 NOTE — Patient Instructions (Signed)
 Patient Action Plan  1. Allergic Rhinitis:    - Continue using Nasonex  (mometasone ) nasal spray daily until your ENT evaluation.    - Follow up with [facility] for further evaluation of nasal and throat symptoms. Contact them at [phone].  2. Stress-Related Insomnia:    - Consider starting sertraline  if stress continues to affect your sleep, understanding it needs daily use for effectiveness.    - Explore behavioral therapy options for managing stress. Contact [facility] at [phone] for resources.  Red Flags: Reach out to your healthcare provider if you experience severe nasal or throat discomfort, worsening insomnia, or any adverse reactions to medications.

## 2024-05-25 NOTE — Progress Notes (Signed)
 Primary Care / Sports Medicine Virtual Visit  Patient Information:  Patient ID: Twan Harkin, male DOB: 20-Jan-1971 Age: 53 y.o. MRN: [id number]   Maximo Spratling is a pleasant 53 y.o. male presenting with the following:  Chief Complaint  Patient presents with   Medical Management of Chronic Issues    He has not seen ENT. He is feeling well and would like to discuss ENT referral.    Review of Systems: No fevers, chills, night sweats, weight loss, chest pain, or shortness of breath.   Patient Active Problem List   Diagnosis Date Noted   Annual physical exam 03/21/2023   Shortness of breath 03/21/2023   Lumbosacral spondylosis with radiculopathy 03/18/2023   Primary osteoarthritis of right hip 03/18/2023   Allergic rhinitis 12/21/2022   Elevated blood pressure reading in office without diagnosis of hypertension 12/21/2022   Prostate cancer screening 02/07/2022   Colon cancer screening 02/07/2022   Low libido 11/29/2021   Anxiety 11/29/2021   Chronic fatigue 09/25/2016   DOE (dyspnea on exertion) 09/25/2016   Exertional chest pain 09/25/2016   Elevated alanine aminotransferase (ALT) level 02/10/2013   Hyperlipidemia 01/23/2013   Back pain 01/15/2013   GERD (gastroesophageal reflux disease) 01/15/2013   Prostatitis, chronic 01/15/2013   Past Medical History:  Diagnosis Date   Flatulence 12/27/2021   GERD (gastroesophageal reflux disease)    Hyperlipidemia    Outpatient Encounter Medications as of 05/25/2024  Medication Sig   albuterol  (VENTOLIN  HFA) 108 (90 Base) MCG/ACT inhaler Inhale 2 puffs into the lungs every 6 (six) hours as needed for wheezing or shortness of breath.   mometasone  (NASONEX ) 50 MCG/ACT nasal spray One spray in each nostril twice a day, use left hand for right nostril, and right hand for left nostril.  Please dispense one bottle.   TESTOSTERONE IM Inject into the muscle.   sertraline  (ZOLOFT ) 50 MG tablet Take 1 tablet (50 mg total) by mouth daily. (Patient  not taking: Reported on 05/25/2024)   traZODone  (DESYREL ) 50 MG tablet Take 0.5-2 tablets (25-100 mg total) by mouth at bedtime as needed. (Patient not taking: Reported on 05/25/2024)   [DISCONTINUED] anastrozole (ARIMIDEX) 1 MG tablet Take 1 mg by mouth once a week.   [DISCONTINUED] hydrOXYzine  (ATARAX ) 25 MG tablet Take 1 tablet (25 mg total) by mouth 4 (four) times daily as needed for anxiety.   [DISCONTINUED] methocarbamol  (ROBAXIN ) 500 MG tablet Take 1 tablet (500 mg total) by mouth every 8 (eight) hours as needed for muscle spasms.   No facility-administered encounter medications on file as of 05/25/2024.   Past Surgical History:  Procedure Laterality Date   COLONOSCOPY WITH PROPOFOL  N/A 09/25/2022   Procedure: COLONOSCOPY WITH PROPOFOL ;  Surgeon: Luke Salaam, MD;  Location: [facility] ENDOSCOPY;  Service: Gastroenterology;  Laterality: N/A;   DENTAL SURGERY     ROOT CANAL     WISDOM TOOTH EXTRACTION Bilateral     Virtual Visit via MyChart Video:   I connected with Jourden Gilson on 05/25/24 via MyChart Video and verified that I am speaking with the correct person using appropriate identifiers.   The limitations, risks, security and privacy concerns of performing an evaluation and management service by MyChart Video, including the higher likelihood of inaccurate diagnoses and treatments, and the availability of in person appointments were reviewed. The possible need of an additional face-to-face encounter for complete and high quality delivery of care was discussed. The patient was also made aware that there may be a patient responsible  charge related to this service. The patient expressed understanding and wishes to proceed.  Provider location is in medical facility. Patient location is at their home, different from provider location. People involved in care of the patient during this telehealth encounter were myself, my nurse/medical assistant, and my front office/scheduling team  member.  Objective findings:   General: Speaking full sentences, no audible heavy breathing. Sounds alert and appropriately interactive. Well-appearing. Face symmetric. Extraocular movements intact. Pupils equal and round. No nasal flaring or accessory muscle use visualized.  Independent interpretation of notes and tests performed by another provider:   None  Pertinent History, Exam, Impression, and Recommendations:   Problem List Items Addressed This Visit     Allergic rhinitis - Primary (Chronic)   He has experienced significant improvement in his breathing issues, which previously caused nocturnal awakenings with a fear of choking and necessitated mouth breathing. This improvement is attributed to allergy management and the use of a nasal spray and inhaler. Despite this, he still experiences occasional nasal stuffiness and intermittent tonsillar swelling, leading to discomfort. He continues to use the steroid nasal spray, Nasonex  (mometasone ), daily.  Allergic rhinitis Intermittent nasal congestion improved with Nasonex . Continued use recommended until ENT evaluation. - Continue Nasonex  (mometasone ) nasal spray daily until ENT evaluation. - Referral to ENT for further evaluation of nasal and throat symptoms placed at last visit, phone number provided to patient today: [facility] [place], [place]  [phone]      Anxiety   He was previously prescribed sertraline  for anxiety and trazodone  for sleep but opted not to take it, feeling it was unnecessary. He reports improved stress levels and is not currently taking any medication for stress.  Stress-related insomnia Discussed potential need for daily sertraline  if stress impacts sleep. Consideration of behavioral therapy for stressors. - Consider starting sertraline  if stress continues to impact sleep quality, with the understanding that it requires daily use for effectiveness. - Refer to care coordination for  potential behavioral therapy resources. [facility]  [facility], [facility] Guide  Direct Dial: [phone]         Orders & Medications Medications: No orders of the defined types were placed in this encounter.  No orders of the defined types were placed in this encounter.    I discussed the above assessment and treatment plan with the patient. The patient was provided an opportunity to ask questions and all were answered. The patient agreed with the plan and demonstrated an understanding of the instructions.   The patient was advised to call back or seek an in-person evaluation if the symptoms worsen or if the condition fails to improve as anticipated.   I provided a total time of 30 minutes including both face-to-face and non-face-to-face time on 05/25/2024 inclusive of time utilized for medical chart review, information gathering, care coordination with staff, and documentation completion.    Ma Saupe, MD, [facility]   Primary Care Sports Medicine Primary Care and Sports Medicine at [facility]

## 2024-05-25 NOTE — Assessment & Plan Note (Signed)
 He was previously prescribed sertraline  for anxiety and trazodone  for sleep but opted not to take it, feeling it was unnecessary. He reports improved stress levels and is not currently taking any medication for stress.  Stress-related insomnia Discussed potential need for daily sertraline  if stress impacts sleep. Consideration of behavioral therapy for stressors. - Consider starting sertraline  if stress continues to impact sleep quality, with the understanding that it requires daily use for effectiveness. - Refer to care coordination for potential behavioral therapy resources. [facility]  [facility], [facility] Guide  Direct Dial: [phone]

## 2024-06-18 ENCOUNTER — Other Ambulatory Visit: Payer: Self-pay | Admitting: Family Medicine

## 2024-06-18 DIAGNOSIS — F419 Anxiety disorder, unspecified: Secondary | ICD-10-CM

## 2024-06-20 ENCOUNTER — Other Ambulatory Visit: Payer: Self-pay | Admitting: Family Medicine

## 2024-06-22 NOTE — Telephone Encounter (Signed)
 Requested Prescriptions  Pending Prescriptions Disp Refills   sertraline  (ZOLOFT ) 50 MG tablet [Pharmacy Med Name: SERTRALINE  HCL 50 MG TABLET] 90 tablet 0    Sig: TAKE 1 TABLET BY MOUTH EVERY DAY     Psychiatry:  Antidepressants - SSRI - sertraline  Passed - 06/22/2024 12:40 PM      Passed - AST in normal range and within 360 days    AST  Date Value Ref Range Status  03/23/2024 16 0 - 40 IU/L Final         Passed - ALT in normal range and within 360 days    ALT  Date Value Ref Range Status  03/23/2024 23 0 - 44 IU/L Final         Passed - Completed PHQ-2 or PHQ-9 in the last 360 days      Passed - Valid encounter within last 6 months    Recent Outpatient Visits           4 weeks ago Seasonal allergic rhinitis due to pollen   [facility] Primary Care & Sports Medicine at [facility], Selinda PARAS, MD   3 months ago Healthcare maintenance   [facility] Primary Care & Sports Medicine at [facility], Selinda PARAS, MD

## 2024-06-23 NOTE — Telephone Encounter (Signed)
 Requested Prescriptions  Pending Prescriptions Disp Refills   traZODone  (DESYREL ) 50 MG tablet [Pharmacy Med Name: TRAZODONE  50 MG TABLET] 180 tablet 0    Sig: TAKE 1/2 TO 2 TABLETS BY MOUTH AT BEDTIME AS NEEDED     Psychiatry: Antidepressants - Serotonin Modulator Passed - 06/23/2024 12:33 PM      Passed - Valid encounter within last 6 months    Recent Outpatient Visits           4 weeks ago Seasonal allergic rhinitis due to pollen   [facility] Primary Care & Sports Medicine at [facility], Selinda PARAS, MD   3 months ago Healthcare maintenance   [facility] Primary Care & Sports Medicine at [facility], Selinda PARAS, MD
